# Patient Record
Sex: Male | Born: 1990 | Hispanic: Yes | Marital: Single | Smoking: Former smoker
Health system: Southern US, Community
[De-identification: ages and names within clinical notes are randomized; demographics above are authoritative.]

---

## 2011-01-09 ENCOUNTER — Emergency Department (HOSPITAL_COMMUNITY)
Admission: EM | Admit: 2011-01-09 | Discharge: 2011-01-09 | Disposition: A | Discharge status: Home / Self Care | Payer: Self-pay | Attending: Emergency Medicine | Admitting: Emergency Medicine

## 2011-01-09 DIAGNOSIS — R05 Cough: Secondary | ICD-10-CM | POA: Insufficient documentation

## 2011-01-09 DIAGNOSIS — J069 Acute upper respiratory infection, unspecified: Secondary | ICD-10-CM | POA: Insufficient documentation

## 2011-01-09 DIAGNOSIS — J029 Acute pharyngitis, unspecified: Secondary | ICD-10-CM | POA: Insufficient documentation

## 2011-01-09 DIAGNOSIS — R059 Cough, unspecified: Secondary | ICD-10-CM | POA: Insufficient documentation

## 2011-05-19 ENCOUNTER — Emergency Department (HOSPITAL_COMMUNITY)
Admission: EM | Admit: 2011-05-19 | Discharge: 2011-05-19 | Disposition: A | Discharge status: Home / Self Care | Payer: Self-pay | Attending: Emergency Medicine | Admitting: Emergency Medicine

## 2011-05-19 DIAGNOSIS — Z0389 Encounter for observation for other suspected diseases and conditions ruled out: Secondary | ICD-10-CM | POA: Insufficient documentation

## 2011-05-19 LAB — URINALYSIS, ROUTINE W REFLEX MICROSCOPIC
Bilirubin Urine: NEGATIVE
Hgb urine dipstick: NEGATIVE
Ketones, ur: NEGATIVE mg/dL
Protein, ur: 30 mg/dL — AB
Urobilinogen, UA: 1 mg/dL (ref 0.0–1.0)

## 2011-05-19 LAB — RAPID URINE DRUG SCREEN, HOSP PERFORMED
Amphetamines: NOT DETECTED
Cocaine: NOT DETECTED
Opiates: NOT DETECTED
Tetrahydrocannabinol: POSITIVE — AB

## 2011-05-19 LAB — URINE MICROSCOPIC-ADD ON

## 2011-05-20 ENCOUNTER — Emergency Department (HOSPITAL_COMMUNITY)
Admission: EM | Admit: 2011-05-20 | Discharge: 2011-05-24 | Disposition: A | Discharge status: Home / Self Care | Payer: Self-pay | Attending: Emergency Medicine | Admitting: Emergency Medicine

## 2011-05-20 ENCOUNTER — Ambulatory Visit (HOSPITAL_COMMUNITY)
Admission: RE | Admit: 2011-05-20 | Discharge: 2011-05-20 | Disposition: A | Discharge status: Home / Self Care | Payer: Self-pay | Attending: Psychiatry | Admitting: Psychiatry

## 2011-05-20 DIAGNOSIS — F1920 Other psychoactive substance dependence, uncomplicated: Secondary | ICD-10-CM | POA: Insufficient documentation

## 2011-05-20 DIAGNOSIS — F29 Unspecified psychosis not due to a substance or known physiological condition: Secondary | ICD-10-CM | POA: Insufficient documentation

## 2011-05-20 DIAGNOSIS — F1910 Other psychoactive substance abuse, uncomplicated: Secondary | ICD-10-CM | POA: Insufficient documentation

## 2011-05-20 LAB — RAPID URINE DRUG SCREEN, HOSP PERFORMED
Opiates: NOT DETECTED
Tetrahydrocannabinol: POSITIVE — AB

## 2011-05-20 LAB — COMPREHENSIVE METABOLIC PANEL
AST: 26 U/L (ref 0–37)
Alkaline Phosphatase: 62 U/L (ref 39–117)
BUN: 7 mg/dL (ref 6–23)
CO2: 25 mEq/L (ref 19–32)
Chloride: 103 mEq/L (ref 96–112)
Creatinine, Ser: 0.61 mg/dL (ref 0.50–1.35)
GFR calc non Af Amer: >90 mL/min (ref >90)
Potassium: 3.6 mEq/L (ref 3.5–5.1)
Total Bilirubin: 0.9 mg/dL (ref 0.3–1.2)

## 2011-05-20 LAB — CBC
HCT: 43 % (ref 39.0–52.0)
MCV: 89.4 fL (ref 78.0–100.0)
Platelets: 230 10*3/uL (ref 150–400)
RBC: 4.81 MIL/uL (ref 4.22–5.81)
WBC: 8.5 10*3/uL (ref 4.0–10.5)

## 2011-05-20 LAB — DIFFERENTIAL
Lymphocytes Relative: 31 % (ref 12–46)
Lymphs Abs: 2.7 10*3/uL (ref 0.7–4.0)
Neutrophils Relative %: 62 % (ref 43–77)

## 2016-07-28 ENCOUNTER — Emergency Department (HOSPITAL_COMMUNITY): Payer: No Typology Code available for payment source

## 2016-07-28 ENCOUNTER — Emergency Department (HOSPITAL_COMMUNITY)
Admission: EM | Admit: 2016-07-28 | Discharge: 2016-07-28 | Disposition: A | Discharge status: Home / Self Care | Payer: No Typology Code available for payment source | Attending: Emergency Medicine | Admitting: Emergency Medicine

## 2016-07-28 ENCOUNTER — Encounter (HOSPITAL_COMMUNITY): Payer: Self-pay

## 2016-07-28 DIAGNOSIS — M25552 Pain in left hip: Secondary | ICD-10-CM | POA: Diagnosis not present

## 2016-07-28 DIAGNOSIS — F17210 Nicotine dependence, cigarettes, uncomplicated: Secondary | ICD-10-CM | POA: Diagnosis not present

## 2016-07-28 DIAGNOSIS — Y92410 Unspecified street and highway as the place of occurrence of the external cause: Secondary | ICD-10-CM | POA: Insufficient documentation

## 2016-07-28 DIAGNOSIS — Y999 Unspecified external cause status: Secondary | ICD-10-CM | POA: Insufficient documentation

## 2016-07-28 DIAGNOSIS — Z23 Encounter for immunization: Secondary | ICD-10-CM | POA: Insufficient documentation

## 2016-07-28 DIAGNOSIS — M79605 Pain in left leg: Secondary | ICD-10-CM

## 2016-07-28 DIAGNOSIS — Y939 Activity, unspecified: Secondary | ICD-10-CM | POA: Insufficient documentation

## 2016-07-28 DIAGNOSIS — S80812A Abrasion, left lower leg, initial encounter: Secondary | ICD-10-CM | POA: Diagnosis not present

## 2016-07-28 DIAGNOSIS — S8992XA Unspecified injury of left lower leg, initial encounter: Secondary | ICD-10-CM | POA: Diagnosis present

## 2016-07-28 MED ORDER — TETANUS-DIPHTH-ACELL PERTUSSIS 5-2.5-18.5 LF-MCG/0.5 IM SUSP
0.5000 mL | Freq: Once | INTRAMUSCULAR | Status: AC
  Administered 2016-07-28: 0.5 mL via INTRAMUSCULAR
  Filled 2016-07-28: qty 0.5

## 2016-07-28 MED ORDER — NAPROXEN 500 MG PO TABS: 500.0000 mg | ORAL_TABLET | Freq: Two times a day (BID) | ORAL | 0 refills | Status: DC

## 2016-07-28 NOTE — ED Triage Notes (Signed)
Abrasion noted to upper left medial leg.
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
## 2023-03-04 ENCOUNTER — Encounter (HOSPITAL_COMMUNITY): Payer: Self-pay

## 2023-03-04 ENCOUNTER — Other Ambulatory Visit: Payer: Self-pay

## 2023-03-04 ENCOUNTER — Inpatient Hospital Stay (HOSPITAL_COMMUNITY)
Admission: EM | Admit: 2023-03-04 | Discharge: 2023-03-08 | DRG: 637 | Disposition: A | Discharge status: Home / Self Care | Payer: Medicaid Other | Attending: Critical Care Medicine | Admitting: Critical Care Medicine

## 2023-03-04 ENCOUNTER — Emergency Department (HOSPITAL_COMMUNITY): Payer: Medicaid Other

## 2023-03-04 DIAGNOSIS — G9341 Metabolic encephalopathy: Secondary | ICD-10-CM | POA: Diagnosis present

## 2023-03-04 DIAGNOSIS — E860 Dehydration: Secondary | ICD-10-CM | POA: Diagnosis present

## 2023-03-04 DIAGNOSIS — F17210 Nicotine dependence, cigarettes, uncomplicated: Secondary | ICD-10-CM | POA: Diagnosis present

## 2023-03-04 DIAGNOSIS — R45851 Suicidal ideations: Secondary | ICD-10-CM | POA: Diagnosis present

## 2023-03-04 DIAGNOSIS — E875 Hyperkalemia: Secondary | ICD-10-CM | POA: Diagnosis present

## 2023-03-04 DIAGNOSIS — E1111 Type 2 diabetes mellitus with ketoacidosis with coma: Principal | ICD-10-CM

## 2023-03-04 DIAGNOSIS — E1110 Type 2 diabetes mellitus with ketoacidosis without coma: Secondary | ICD-10-CM

## 2023-03-04 DIAGNOSIS — E1011 Type 1 diabetes mellitus with ketoacidosis with coma: Secondary | ICD-10-CM | POA: Diagnosis present

## 2023-03-04 DIAGNOSIS — E876 Hypokalemia: Secondary | ICD-10-CM | POA: Diagnosis present

## 2023-03-04 DIAGNOSIS — Z79899 Other long term (current) drug therapy: Secondary | ICD-10-CM | POA: Diagnosis not present

## 2023-03-04 DIAGNOSIS — Z597 Insufficient social insurance and welfare support: Secondary | ICD-10-CM

## 2023-03-04 DIAGNOSIS — I152 Hypertension secondary to endocrine disorders: Secondary | ICD-10-CM

## 2023-03-04 DIAGNOSIS — N179 Acute kidney failure, unspecified: Secondary | ICD-10-CM | POA: Diagnosis present

## 2023-03-04 DIAGNOSIS — I10 Essential (primary) hypertension: Secondary | ICD-10-CM | POA: Diagnosis present

## 2023-03-04 DIAGNOSIS — E872 Acidosis, unspecified: Secondary | ICD-10-CM

## 2023-03-04 HISTORY — DX: Type 2 diabetes mellitus with ketoacidosis without coma: E11.10

## 2023-03-04 LAB — RAPID URINE DRUG SCREEN, HOSP PERFORMED
Amphetamines: NOT DETECTED
Barbiturates: NOT DETECTED
Benzodiazepines: NOT DETECTED
Cocaine: NOT DETECTED
Opiates: NOT DETECTED
Tetrahydrocannabinol: POSITIVE — AB

## 2023-03-04 LAB — BASIC METABOLIC PANEL
Anion gap: 21 — ABNORMAL HIGH (ref 5–15)
Anion gap: 19 — ABNORMAL HIGH (ref 5–15)
Anion gap: 14 (ref 5–15)
Anion gap: 12 (ref 5–15)
Anion gap: 12 (ref 5–15)
BUN: 57 mg/dL — ABNORMAL HIGH (ref 6–20)
BUN: 51 mg/dL — ABNORMAL HIGH (ref 6–20)
BUN: 49 mg/dL — ABNORMAL HIGH (ref 6–20)
BUN: 45 mg/dL — ABNORMAL HIGH (ref 6–20)
BUN: 47 mg/dL — ABNORMAL HIGH (ref 6–20)
BUN: 42 mg/dL — ABNORMAL HIGH (ref 6–20)
CO2: <7 mmol/L — ABNORMAL LOW (ref 22–32)
CO2: 8 mmol/L — ABNORMAL LOW (ref 22–32)
CO2: 9 mmol/L — ABNORMAL LOW (ref 22–32)
CO2: 14 mmol/L — ABNORMAL LOW (ref 22–32)
CO2: 16 mmol/L — ABNORMAL LOW (ref 22–32)
CO2: 14 mmol/L — ABNORMAL LOW (ref 22–32)
Calcium: 8.7 mg/dL — ABNORMAL LOW (ref 8.9–10.3)
Calcium: 8.9 mg/dL (ref 8.9–10.3)
Calcium: 8.9 mg/dL (ref 8.9–10.3)
Calcium: 8.8 mg/dL — ABNORMAL LOW (ref 8.9–10.3)
Calcium: 8.8 mg/dL — ABNORMAL LOW (ref 8.9–10.3)
Calcium: 8.7 mg/dL — ABNORMAL LOW (ref 8.9–10.3)
Chloride: 101 mmol/L (ref 98–111)
Chloride: 109 mmol/L (ref 98–111)
Chloride: 113 mmol/L — ABNORMAL HIGH (ref 98–111)
Chloride: 113 mmol/L — ABNORMAL HIGH (ref 98–111)
Chloride: 115 mmol/L — ABNORMAL HIGH (ref 98–111)
Chloride: 115 mmol/L — ABNORMAL HIGH (ref 98–111)
Creatinine, Ser: 5.91 mg/dL — ABNORMAL HIGH (ref 0.61–1.24)
Creatinine, Ser: 3.89 mg/dL — ABNORMAL HIGH (ref 0.61–1.24)
Creatinine, Ser: 3.54 mg/dL — ABNORMAL HIGH (ref 0.61–1.24)
Creatinine, Ser: 2.81 mg/dL — ABNORMAL HIGH (ref 0.61–1.24)
Creatinine, Ser: 2.36 mg/dL — ABNORMAL HIGH (ref 0.61–1.24)
Creatinine, Ser: 2.1 mg/dL — ABNORMAL HIGH (ref 0.61–1.24)
GFR, Estimated: 12 mL/min — ABNORMAL LOW (ref >60)
GFR, Estimated: 20 mL/min — ABNORMAL LOW (ref >60)
GFR, Estimated: 22 mL/min — ABNORMAL LOW (ref >60)
GFR, Estimated: 30 mL/min — ABNORMAL LOW (ref >60)
GFR, Estimated: 37 mL/min — ABNORMAL LOW (ref >60)
GFR, Estimated: 42 mL/min — ABNORMAL LOW (ref >60)
Glucose, Bld: 913 mg/dL (ref 70–99)
Glucose, Bld: 582 mg/dL (ref 70–99)
Glucose, Bld: 357 mg/dL — ABNORMAL HIGH (ref 70–99)
Glucose, Bld: 240 mg/dL — ABNORMAL HIGH (ref 70–99)
Glucose, Bld: 223 mg/dL — ABNORMAL HIGH (ref 70–99)
Glucose, Bld: 213 mg/dL — ABNORMAL HIGH (ref 70–99)
Potassium: 6.4 mmol/L (ref 3.5–5.1)
Potassium: 4 mmol/L (ref 3.5–5.1)
Potassium: 3.2 mmol/L — ABNORMAL LOW (ref 3.5–5.1)
Potassium: 3.4 mmol/L — ABNORMAL LOW (ref 3.5–5.1)
Potassium: 3.4 mmol/L — ABNORMAL LOW (ref 3.5–5.1)
Potassium: 3.4 mmol/L — ABNORMAL LOW (ref 3.5–5.1)
Sodium: 128 mmol/L — ABNORMAL LOW (ref 135–145)
Sodium: 138 mmol/L (ref 135–145)
Sodium: 141 mmol/L (ref 135–145)
Sodium: 141 mmol/L (ref 135–145)
Sodium: 143 mmol/L (ref 135–145)
Sodium: 141 mmol/L (ref 135–145)

## 2023-03-04 LAB — GLUCOSE, CAPILLARY
Glucose-Capillary: 171 mg/dL — ABNORMAL HIGH (ref 70–99)
Glucose-Capillary: 173 mg/dL — ABNORMAL HIGH (ref 70–99)
Glucose-Capillary: 192 mg/dL — ABNORMAL HIGH (ref 70–99)
Glucose-Capillary: 193 mg/dL — ABNORMAL HIGH (ref 70–99)
Glucose-Capillary: 202 mg/dL — ABNORMAL HIGH (ref 70–99)
Glucose-Capillary: 207 mg/dL — ABNORMAL HIGH (ref 70–99)
Glucose-Capillary: 207 mg/dL — ABNORMAL HIGH (ref 70–99)
Glucose-Capillary: 209 mg/dL — ABNORMAL HIGH (ref 70–99)
Glucose-Capillary: 224 mg/dL — ABNORMAL HIGH (ref 70–99)
Glucose-Capillary: 224 mg/dL — ABNORMAL HIGH (ref 70–99)
Glucose-Capillary: 228 mg/dL — ABNORMAL HIGH (ref 70–99)
Glucose-Capillary: 230 mg/dL — ABNORMAL HIGH (ref 70–99)
Glucose-Capillary: 231 mg/dL — ABNORMAL HIGH (ref 70–99)
Glucose-Capillary: 255 mg/dL — ABNORMAL HIGH (ref 70–99)
Glucose-Capillary: 273 mg/dL — ABNORMAL HIGH (ref 70–99)
Glucose-Capillary: 355 mg/dL — ABNORMAL HIGH (ref 70–99)
Glucose-Capillary: 365 mg/dL — ABNORMAL HIGH (ref 70–99)
Glucose-Capillary: 405 mg/dL — ABNORMAL HIGH (ref 70–99)
Glucose-Capillary: 427 mg/dL — ABNORMAL HIGH (ref 70–99)
Glucose-Capillary: 474 mg/dL — ABNORMAL HIGH (ref 70–99)
Glucose-Capillary: 488 mg/dL — ABNORMAL HIGH (ref 70–99)
Glucose-Capillary: 506 mg/dL (ref 70–99)
Glucose-Capillary: 541 mg/dL (ref 70–99)
Glucose-Capillary: >600 mg/dL (ref 70–99)
Glucose-Capillary: >600 mg/dL (ref 70–99)

## 2023-03-04 LAB — BLOOD GAS, VENOUS
Acid-base deficit: 28.2 mmol/L — ABNORMAL HIGH (ref 0.0–2.0)
Acid-base deficit: 19.3 mmol/L — ABNORMAL HIGH (ref 0.0–2.0)
Bicarbonate: 4.9 mmol/L — ABNORMAL LOW (ref 20.0–28.0)
Bicarbonate: 7.5 mmol/L — ABNORMAL LOW (ref 20.0–28.0)
Drawn by: 63366
O2 Saturation: 67.7 %
O2 Saturation: 76.5 %
Patient temperature: 37
Patient temperature: 37
pCO2, Ven: 28 mmHg — ABNORMAL LOW (ref 44–60)
pCO2, Ven: 21 mmHg — ABNORMAL LOW (ref 44–60)
pH, Ven: <6.95 — CL (ref 7.25–7.43)
pH, Ven: 7.16 — CL (ref 7.25–7.43)
pO2, Ven: 37 mmHg (ref 32–45)
pO2, Ven: 38 mmHg (ref 32–45)

## 2023-03-04 LAB — URINALYSIS, ROUTINE W REFLEX MICROSCOPIC
Bilirubin Urine: NEGATIVE
Glucose, UA: >=500 mg/dL — AB
Ketones, ur: 80 mg/dL — AB
Leukocytes,Ua: NEGATIVE
Nitrite: NEGATIVE
Protein, ur: >=300 mg/dL — AB
Specific Gravity, Urine: 1.014 (ref 1.005–1.030)
pH: 6 (ref 5.0–8.0)

## 2023-03-04 LAB — CBC WITH DIFFERENTIAL/PLATELET
Abs Immature Granulocytes: 0.7 10*3/uL — ABNORMAL HIGH (ref 0.00–0.07)
Band Neutrophils: 6 %
Basophils Absolute: 0 10*3/uL (ref 0.0–0.1)
Basophils Relative: 0 %
Eosinophils Absolute: 0 10*3/uL (ref 0.0–0.5)
Eosinophils Relative: 0 %
HCT: 44.7 % (ref 39.0–52.0)
Hemoglobin: 15.3 g/dL (ref 13.0–17.0)
Lymphocytes Relative: 6 %
Lymphs Abs: 1.1 10*3/uL (ref 0.7–4.0)
MCH: 31.7 pg (ref 26.0–34.0)
MCHC: 34.2 g/dL (ref 30.0–36.0)
MCV: 92.5 fL (ref 80.0–100.0)
Metamyelocytes Relative: 4 %
Monocytes Absolute: 0.2 10*3/uL (ref 0.1–1.0)
Monocytes Relative: 1 %
Neutro Abs: 16.2 10*3/uL — ABNORMAL HIGH (ref 1.7–7.7)
Neutrophils Relative %: 83 %
Platelets: 164 10*3/uL (ref 150–400)
RBC: 4.83 MIL/uL (ref 4.22–5.81)
RDW: 15.1 % (ref 11.5–15.5)
WBC: 18.2 10*3/uL — ABNORMAL HIGH (ref 4.0–10.5)
nRBC: 0.2 % (ref 0.0–0.2)

## 2023-03-04 LAB — I-STAT CHEM 8, ED
BUN: 51 mg/dL — ABNORMAL HIGH (ref 6–20)
Calcium, Ion: 1.27 mmol/L (ref 1.15–1.40)
Chloride: 109 mmol/L (ref 98–111)
Creatinine, Ser: 6.1 mg/dL — ABNORMAL HIGH (ref 0.61–1.24)
Glucose, Bld: >700 mg/dL (ref 70–99)
HCT: 45 % (ref 39.0–52.0)
Hemoglobin: 15.3 g/dL (ref 13.0–17.0)
Potassium: 6.4 mmol/L (ref 3.5–5.1)
Sodium: 130 mmol/L — ABNORMAL LOW (ref 135–145)
TCO2: 7 mmol/L — ABNORMAL LOW (ref 22–32)

## 2023-03-04 LAB — HIV ANTIBODY (ROUTINE TESTING W REFLEX): HIV Screen 4th Generation wRfx: NONREACTIVE

## 2023-03-04 LAB — BETA-HYDROXYBUTYRIC ACID
Beta-Hydroxybutyric Acid: >8 mmol/L — ABNORMAL HIGH (ref 0.05–0.27)
Beta-Hydroxybutyric Acid: 5.41 mmol/L — ABNORMAL HIGH (ref 0.05–0.27)
Beta-Hydroxybutyric Acid: 4.11 mmol/L — ABNORMAL HIGH (ref 0.05–0.27)

## 2023-03-04 LAB — CBG MONITORING, ED
Glucose-Capillary: >600 mg/dL (ref 70–99)
Glucose-Capillary: >600 mg/dL (ref 70–99)
Glucose-Capillary: >600 mg/dL (ref 70–99)

## 2023-03-04 LAB — LACTIC ACID, PLASMA
Lactic Acid, Venous: 1.2 mmol/L (ref 0.5–1.9)
Lactic Acid, Venous: 1.5 mmol/L (ref 0.5–1.9)

## 2023-03-04 LAB — HEMOGLOBIN A1C
Hgb A1c MFr Bld: 14.8 % — ABNORMAL HIGH (ref 4.8–5.6)
Mean Plasma Glucose: 378.06 mg/dL

## 2023-03-04 LAB — TROPONIN I (HIGH SENSITIVITY)
Troponin I (High Sensitivity): 13 ng/L (ref <18)
Troponin I (High Sensitivity): 11 ng/L (ref <18)

## 2023-03-04 LAB — MRSA NEXT GEN BY PCR, NASAL: MRSA by PCR Next Gen: NOT DETECTED

## 2023-03-04 LAB — SALICYLATE LEVEL: Salicylate Lvl: <7 mg/dL — ABNORMAL LOW (ref 7.0–30.0)

## 2023-03-04 LAB — ACETAMINOPHEN LEVEL: Acetaminophen (Tylenol), Serum: <10 ug/mL — ABNORMAL LOW (ref 10–30)

## 2023-03-04 MED ORDER — ORAL CARE MOUTH RINSE: 15.0000 mL | OROMUCOSAL | Status: DC | PRN

## 2023-03-04 MED ORDER — ORAL CARE MOUTH RINSE: 15.0000 mL | OROMUCOSAL | Status: DC

## 2023-03-04 MED ORDER — INSULIN STARTER KIT- PEN NEEDLES (SPANISH)
1.0000 | Freq: Once | Status: DC
  Filled 2023-03-04: qty 1

## 2023-03-04 MED ORDER — POTASSIUM CHLORIDE 10 MEQ/100ML IV SOLN
10.0000 meq | INTRAVENOUS | Status: AC
  Administered 2023-03-04 (×2): 10 meq via INTRAVENOUS
  Filled 2023-03-04 (×2): qty 100

## 2023-03-04 MED ORDER — DEXTROSE IN LACTATED RINGERS 5 % IV SOLN: INTRAVENOUS | Status: DC

## 2023-03-04 MED ORDER — LIVING WELL WITH DIABETES BOOK - IN SPANISH
Freq: Once | Status: DC
  Filled 2023-03-04: qty 1

## 2023-03-04 MED ORDER — SODIUM CHLORIDE 0.45 % IV SOLN
Freq: Once | INTRAVENOUS | Status: AC
  Filled 2023-03-04: qty 75

## 2023-03-04 MED ORDER — CALCIUM GLUCONATE 10 % IV SOLN
1.0000 g | Freq: Once | INTRAVENOUS | Status: AC
  Administered 2023-03-04: 1 g via INTRAVENOUS
  Filled 2023-03-04: qty 10

## 2023-03-04 MED ORDER — MAGNESIUM SULFATE 2 GM/50ML IV SOLN
2.0000 g | Freq: Once | INTRAVENOUS | Status: AC
  Administered 2023-03-04: 2 g via INTRAVENOUS
  Filled 2023-03-04: qty 50

## 2023-03-04 MED ORDER — DEXTROSE 50 % IV SOLN: 0.0000 mL | INTRAVENOUS | Status: DC | PRN

## 2023-03-04 MED ORDER — PROMETHAZINE (PHENERGAN) 6.25MG IN NS 50ML IVPB
6.2500 mg | Freq: Four times a day (QID) | INTRAVENOUS | Status: DC | PRN
  Administered 2023-03-06 – 2023-03-07 (×2): 6.25 mg via INTRAVENOUS
  Filled 2023-03-04: qty 6.25
  Filled 2023-03-04: qty 50
  Filled 2023-03-04: qty 6.25

## 2023-03-04 MED ORDER — POTASSIUM CHLORIDE CRYS ER 20 MEQ PO TBCR: 40.0000 meq | EXTENDED_RELEASE_TABLET | Freq: Once | ORAL | Status: DC

## 2023-03-04 MED ORDER — LACTATED RINGERS IV BOLUS: 20.0000 mL/kg | Freq: Once | INTRAVENOUS | Status: DC

## 2023-03-04 MED ORDER — LACTATED RINGERS IV SOLN: INTRAVENOUS | Status: DC

## 2023-03-04 MED ORDER — LACTATED RINGERS IV BOLUS
1000.0000 mL | INTRAVENOUS | Status: AC
  Administered 2023-03-04 (×2): 1000 mL via INTRAVENOUS

## 2023-03-04 MED ORDER — POTASSIUM CHLORIDE 10 MEQ/100ML IV SOLN
10.0000 meq | INTRAVENOUS | Status: AC
  Administered 2023-03-04 (×6): 10 meq via INTRAVENOUS
  Filled 2023-03-04 (×6): qty 100

## 2023-03-04 MED ORDER — INSULIN REGULAR(HUMAN) IN NACL 100-0.9 UT/100ML-% IV SOLN
INTRAVENOUS | Status: DC
  Administered 2023-03-04: 4.2 [IU]/h via INTRAVENOUS
  Filled 2023-03-04: qty 100

## 2023-03-04 MED ORDER — LORAZEPAM 2 MG/ML IJ SOLN
0.5000 mg | INTRAMUSCULAR | Status: DC | PRN
  Administered 2023-03-04: 0.5 mg via INTRAVENOUS
  Filled 2023-03-04: qty 1

## 2023-03-04 MED ORDER — CHLORHEXIDINE GLUCONATE CLOTH 2 % EX PADS
6.0000 | MEDICATED_PAD | Freq: Every day | CUTANEOUS | Status: DC
  Administered 2023-03-04 – 2023-03-06 (×3): 6 via TOPICAL

## 2023-03-04 MED ORDER — ONDANSETRON 8 MG/NS 50 ML IVPB
8.0000 mg | Freq: Four times a day (QID) | INTRAVENOUS | Status: DC | PRN
  Administered 2023-03-04 – 2023-03-05 (×2): 8 mg via INTRAVENOUS
  Filled 2023-03-04 (×2): qty 8
  Filled 2023-03-04: qty 54

## 2023-03-04 MED ORDER — LACTATED RINGERS IV BOLUS
1000.0000 mL | Freq: Once | INTRAVENOUS | Status: AC
  Administered 2023-03-04: 1000 mL via INTRAVENOUS

## 2023-03-04 MED ORDER — POTASSIUM CHLORIDE 10 MEQ/100ML IV SOLN
10.0000 meq | INTRAVENOUS | Status: AC
  Administered 2023-03-04 – 2023-03-05 (×6): 10 meq via INTRAVENOUS
  Filled 2023-03-04 (×3): qty 100

## 2023-03-04 MED ORDER — INSULIN REGULAR(HUMAN) IN NACL 100-0.9 UT/100ML-% IV SOLN
INTRAVENOUS | Status: DC
  Administered 2023-03-04: 2.6 [IU]/h via INTRAVENOUS
  Administered 2023-03-04: 4.2 [IU]/h via INTRAVENOUS
  Filled 2023-03-04: qty 100

## 2023-03-04 MED ORDER — OLANZAPINE 10 MG IM SOLR
2.5000 mg | Freq: Four times a day (QID) | INTRAMUSCULAR | Status: DC | PRN
  Administered 2023-03-04: 2.5 mg via INTRAMUSCULAR
  Filled 2023-03-04 (×3): qty 10

## 2023-03-04 MED ORDER — HEPARIN SODIUM (PORCINE) 5000 UNIT/ML IJ SOLN
5000.0000 [IU] | Freq: Three times a day (TID) | INTRAMUSCULAR | Status: DC
  Administered 2023-03-04 – 2023-03-08 (×13): 5000 [IU] via SUBCUTANEOUS
  Filled 2023-03-04 (×11): qty 1

## 2023-03-04 MED ORDER — SODIUM ZIRCONIUM CYCLOSILICATE 10 G PO PACK: 10.0000 g | PACK | Freq: Once | ORAL | Status: DC

## 2023-03-04 MED ORDER — SODIUM BICARBONATE 8.4 % IV SOLN
50.0000 meq | Freq: Once | INTRAVENOUS | Status: AC
  Administered 2023-03-04: 50 meq via INTRAVENOUS
  Filled 2023-03-04: qty 50

## 2023-03-04 MED ORDER — SODIUM CHLORIDE 0.9 % IV SOLN
8.0000 mg | Freq: Four times a day (QID) | INTRAVENOUS | Status: DC | PRN
  Administered 2023-03-04: 8 mg via INTRAVENOUS
  Filled 2023-03-04: qty 8
  Filled 2023-03-04: qty 4

## 2023-03-04 MED ORDER — LORAZEPAM 0.5 MG PO TABS
0.5000 mg | ORAL_TABLET | ORAL | Status: DC | PRN
  Filled 2023-03-04: qty 1

## 2023-03-04 MED ORDER — LIVING WELL WITH DIABETES BOOK
Freq: Once | Status: DC
  Filled 2023-03-04: qty 1

## 2023-03-04 NOTE — Progress Notes (Addendum)
eLink Physician-Brief Progress Note Patient Name: Charles Cooper DOB: [birth year] MRN: [id number]   Date of Service  03/04/2023  HPI/Events of Note  32 year old with a history of DKA presented with encephalopathy, DKA is improving.  Endo tool was improperly scheduled for hyperglycemia rather than DKA.  eICU Interventions  Will switch Endo tool back to DKA management.   2233 -potassium 3.4, remains on insulin drip.  KCl infusion.  96 - Renew Sitter order for SI until psych eval  Intervention Category Minor Interventions: Routine modifications to care plan (e.g. PRN medications for pain, fever)  Charles Cooper 03/04/2023, 9:43 PM

## 2023-03-04 NOTE — Plan of Care (Signed)
  Problem: Coping: Goal: Ability to adjust to condition or change in health will improve Outcome: Progressing   Problem: Fluid Volume: Goal: Ability to maintain a balanced intake and output will improve Outcome: Progressing   
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
## 2023-03-04 NOTE — H&P (Signed)
NAME:  Charles Cooper, MRN:  [id number], DOB:  [birth year], LOS: 0 ADMISSION DATE:  03/04/2023, CONSULTATION DATE: 03/04/2023 REFERRING MD: Dr. Manus Gunning, CHIEF COMPLAINT: DKA  History of Present Illness:  32 year old gentleman who was brought in for altered mental status Agitated at home, by the time he got to the emergency department was somnolent History that was provided was by EMS, patient was stating that he was going to hurt himself.  EMS found his blood sugar too high to detect  Has no history of underlying diabetes known to anyone Pertinent  Medical History  History reviewed. No pertinent past medical history.   Significant Hospital Events: Including procedures, antibiotic start and stop dates in addition to other pertinent events   03/04/2023-critical care consult for DKA, altered mental status, severe acidemia 03/04/2023-CT head with no acute abnormality 03/04/2023-chest x-ray with no acute abnormality  Interim History / Subjective:  Somnolent, he does arouse, unable to provide any history  Objective   Blood pressure 123/63, pulse (!) 123, temperature 98.5 F (36.9 C), temperature source Oral, resp. rate (!) 27, SpO2 100%.       No intake or output data in the 24 hours ending 03/04/23 0240 There were no vitals filed for this visit.  Examination: General: Middle-age, acutely ill-appearing HENT: Very dry mucous membranes Lungs: Clear breath sounds Cardiovascular: S1-S2 appreciated Abdomen: Soft, bowel sounds appreciated Extremities: No clubbing, no edema Neuro: Somnolent, moving all extremities GU:   I reviewed nursing notes, last 24 h vitals and pain scores, last 48 h intake and output, last 24 h labs and trends, and last 24 h imaging results.  Resolved Hospital Problem list     Assessment & Plan:  Diabetic ketoacidosis -Unknown history of diabetes, no listed medications to suggest he has diabetes -DKA management per protocol -On insulin via  infusion -Receiving fluid bolus and will initiate LR at 250 cc an hour following completing the bolus  Severe acidemia with pH 6.95  Acute kidney injury -Likely related to severe dehydration -Maintain renal perfusion -Avoid nephrotoxic medications -Treat underlying DKA  Hyperkalemia -Did receive calcium gluconate, unable to receive Lokelma secondary to his altered mental status -Did receive 1 amp of bicarb -Will place him on a drip of 75 mEq of bicarb in 1 L of 0.45 saline over 2 hours -Continue to monitor electrolytes -EKG is sinus  Monitor electrolytes every 4 Monitor blood sugar hourly Does not appear to have underlying infectious etiology-will monitor, follow cultures, follow urinalysis Follow beta hydroxybutyric acid  Risk of decompensation is high  Altered mental serum is likely related to his DKA Assessment of his mental status and his threats regarding hurting himself will need to be evaluated once he is more stable  Best Practice (right click and "Reselect all SmartList Selections" daily)   Diet/type: NPO DVT prophylaxis: prophylactic heparin  GI prophylaxis: N/A Lines: N/A Foley:  Yes, and it is still needed Code Status:  full code Last date of multidisciplinary goals of care discussion     Labs   CBC: Recent Labs  Lab 03/04/23 0113 03/04/23 0124  WBC 18.2*  --   NEUTROABS 16.2*  --   HGB 15.3 15.3  HCT 44.7 45.0  MCV 92.5  --   PLT 164  --     Basic Metabolic Panel: Recent Labs  Lab 03/04/23 0113 03/04/23 0124  NA 128* 130*  K 6.4* 6.4*  CL 101 109  CO2 <7*  --   GLUCOSE 913* >700*  BUN 57* 51*  CREATININE 5.91* 6.10*  CALCIUM 8.7*  --    GFR: CrCl cannot be calculated (Unknown ideal weight.). Recent Labs  Lab 03/04/23 0113 03/04/23 0119  WBC 18.2*  --   LATICACIDVEN  --  1.2    Liver Function Tests: No results for input(s): "AST", "ALT", "ALKPHOS", "BILITOT", "PROT", "ALBUMIN" in the last 168 hours. No results for input(s):  "LIPASE", "AMYLASE" in the last 168 hours. No results for input(s): "AMMONIA" in the last 168 hours.  ABG    Component Value Date/Time   HCO3 4.9 (L) 03/04/2023 0113   TCO2 7 (L) 03/04/2023 0124   ACIDBASEDEF 28.2 (H) 03/04/2023 0113   O2SAT 67.7 03/04/2023 0113     Coagulation Profile: No results for input(s): "INR", "PROTIME" in the last 168 hours.  Cardiac Enzymes: No results for input(s): "CKTOTAL", "CKMB", "CKMBINDEX", "TROPONINI" in the last 168 hours.  HbA1C: No results found for: "HGBA1C"  CBG: Recent Labs  Lab 03/04/23 0051 03/04/23 0216  GLUCAP >600* >600*    Review of Systems:   Unable to provide history  Past Medical History:  He,  has no past medical history on file.   Surgical History:  History reviewed. No pertinent surgical history.   Social History:   reports that he has been smoking cigarettes. He has never used smokeless tobacco. He reports current drug use. Drug: Marijuana. He reports that he does not drink alcohol.   Family History:  His family history is not on file.   Allergies No Known Allergies   Home Medications  Prior to Admission medications   Medication Sig Start Date End Date Taking? Authorizing Provider  naproxen (NAPROSYN) 500 MG tablet Take 1 tablet (500 mg total) by mouth 2 (two) times daily. 07/28/16   [name], NP     The patient is critically ill with multiple organ systems failure and requires high complexity decision making for assessment and support, frequent evaluation and titration of therapies, application of advanced monitoring technologies and extensive interpretation of multiple databases. Critical Care Time devoted to patient care services described in this note independent of APP/resident time (if applicable)  is 40 minutes.   Virl Diamond MD [facility] Personal pager: See Amion If unanswered, please page CCM On-call: [phone]

## 2023-03-04 NOTE — Progress Notes (Addendum)
eLink Physician-Brief Progress Note Patient Name: Charles Cooper DOB: [birth year] MRN: [id number]   Date of Service  03/04/2023  HPI/Events of Note  32 year old male with a history of diabetes presents with diabetic ketoacidosis and acute encephalopathy.  Presents tachypneic, tachycardic, and mildly hypertensive.  Results consistent with severe hyperglycemia, anion gap metabolic acidosis, acute kidney injury, and leukocytosis.  CT head unremarkable.  ECG with sinus tachycardia.  eICU Interventions  Insulin drip, aggressive fluids, electrolyte repletion.  Labs reviewed-BMP Q4, BHB every 8  Additional LR bolus 1 L, bicarb 1L ordered by primary also  Heparin subcutaneous for DVT prophylaxis  No indication for GI prophylaxis.   0559 -AM ABG reviewed, improved pH.  No indication to repeat.  Continue to trend BMP.  No change in plan.  Intervention Category Evaluation Type: New Patient Evaluation  Charles Cooper 03/04/2023, 4:02 AM

## 2023-03-04 NOTE — Inpatient Diabetes Management (Signed)
Inpatient Diabetes Program Recommendations  AACE/ADA: New Consensus Statement on Inpatient Glycemic Control (2015)  Target Ranges:  Prepandial:   less than 140 mg/dL      Peak postprandial:   less than 180 mg/dL (1-2 hours)      Critically ill patients:  140 - 180 mg/dL   Lab Results  Component Value Date   GLUCAP 405 (H) 03/04/2023    Review of Glycemic Control  Diabetes history: New-onset DM Outpatient Diabetes medications: None Current orders for Inpatient glycemic control: IV insulin per [employer] for DKA  HgbA1C pending Will need affordable insulin at discharge since pt is uninsured.  Ordered Living Well book and insulin pen starter kit.   Inpatient Diabetes Program Recommendations:    New-onset DM. Will begin diabetes teaching when appropriate.  Will need PCP to manage his diabetes after discharge. Order TOC consult  Continue IV insulin until criteria met for discontinuation. Give Semglee 1-2 hours prior to discontinuation of drip.  Will speak with pt and begin educating when appropriate.   Continue to follow.  Thank you. Ailene Ards, RD, LDN, CDCES Inpatient Diabetes Coordinator [phone]

## 2023-03-04 NOTE — Progress Notes (Signed)
Called lab phlebotomy for lab draw timed for 0455.

## 2023-03-04 NOTE — ED Provider Notes (Signed)
[facility] AT [facility] Provider Note   CSN: [id number] Arrival date & time: 03/04/23  0040     History  Chief Complaint  Patient presents with   Hyperglycemia   Suicidal    Charles Cooper is a 32 y.o. male.  Level 5 caveat for altered mental status.  Patient unable give any history.  EMS reports he was telling multiple people today that was having thoughts of wanting to hurt himself.  He was aggressive at home and destroying property.  EMS was called and found patient to have elevated blood sugar too high to detect.  Unknown history of diabetes.  Patient somnolent and altered on arrival and unable to give any history.  He is not speaking.  Vital signs stable for EMS.  The history is provided by the patient and the EMS personnel. The history is limited by the condition of the patient.  Hyperglycemia      Home Medications Prior to Admission medications   Medication Sig Start Date End Date Taking? Authorizing Provider  naproxen (NAPROSYN) 500 MG tablet Take 1 tablet (500 mg total) by mouth 2 (two) times daily. 07/28/16   [name], NP      Allergies    Patient has no known allergies.    Review of Systems   Review of Systems  Unable to perform ROS: Mental status change    Physical Exam Updated Vital Signs BP 123/63   Pulse (!) 123   Temp 98.5 F (36.9 C) (Oral)   Resp (!) 27   SpO2 100%  Physical Exam Vitals and nursing note reviewed.  Constitutional:      General: He is not in acute distress.    Appearance: He is well-developed. He is ill-appearing.     Comments: Obtunded, responds to pain does not speak or follow commands  HENT:     Head: Normocephalic and atraumatic.     Mouth/Throat:     Mouth: Mucous membranes are dry.     Pharynx: No oropharyngeal exudate.  Eyes:     Conjunctiva/sclera: Conjunctivae normal.     Pupils: Pupils are equal, round, and reactive to light.  Neck:     Comments: No  meningismus. Cardiovascular:     Rate and Rhythm: Regular rhythm. Tachycardia present.     Heart sounds: Normal heart sounds. No murmur heard. Pulmonary:     Effort: Pulmonary effort is normal. No respiratory distress.     Breath sounds: Normal breath sounds.  Abdominal:     Palpations: Abdomen is soft.     Tenderness: There is abdominal tenderness. There is no guarding or rebound.     Comments: Diffuse crampy abdominal tenderness, no guarding or rebound  Musculoskeletal:        General: No tenderness. Normal range of motion.     Cervical back: Normal range of motion and neck supple.  Skin:    General: Skin is warm.  Neurological:     Mental Status: He is alert.     Cranial Nerves: No cranial nerve deficit.     Motor: No abnormal muscle tone.     Coordination: Coordination normal.     Comments: Obtunded, moans to pain, moves all extremities. Does not follow commands  Psychiatric:        Behavior: Behavior normal.     ED Results / Procedures / Treatments   Labs (all labs ordered are listed, but only abnormal results are displayed) Labs Reviewed  BASIC METABOLIC PANEL - Abnormal;  Notable for the following components:      Result Value   Sodium 128 (*)    Potassium 6.4 (*)    CO2 <7 (*)    Glucose, Bld 913 (*)    BUN 57 (*)    Creatinine, Ser 5.91 (*)    Calcium 8.7 (*)    GFR, Estimated 12 (*)    All other components within normal limits  BETA-HYDROXYBUTYRIC ACID - Abnormal; Notable for the following components:   Beta-Hydroxybutyric Acid >8.00 (*)    All other components within normal limits  CBC WITH DIFFERENTIAL/PLATELET - Abnormal; Notable for the following components:   WBC 18.2 (*)    Neutro Abs 16.2 (*)    Abs Immature Granulocytes 0.70 (*)    All other components within normal limits  BLOOD GAS, VENOUS - Abnormal; Notable for the following components:   pH, Ven <6.95 (*)    pCO2, Ven 28 (*)    Bicarbonate 4.9 (*)    Acid-base deficit 28.2 (*)    All other  components within normal limits  ACETAMINOPHEN LEVEL - Abnormal; Notable for the following components:   Acetaminophen (Tylenol), Serum <10 (*)    All other components within normal limits  SALICYLATE LEVEL - Abnormal; Notable for the following components:   Salicylate Lvl <7.0 (*)    All other components within normal limits  GLUCOSE, CAPILLARY - Abnormal; Notable for the following components:   Glucose-Capillary >600 (*)    All other components within normal limits  GLUCOSE, CAPILLARY - Abnormal; Notable for the following components:   Glucose-Capillary >600 (*)    All other components within normal limits  GLUCOSE, CAPILLARY - Abnormal; Notable for the following components:   Glucose-Capillary 541 (*)    All other components within normal limits  CBG MONITORING, ED - Abnormal; Notable for the following components:   Glucose-Capillary >600 (*)    All other components within normal limits  CBG MONITORING, ED - Abnormal; Notable for the following components:   Glucose-Capillary >600 (*)    All other components within normal limits  I-STAT CHEM 8, ED - Abnormal; Notable for the following components:   Sodium 130 (*)    Potassium 6.4 (*)    BUN 51 (*)    Creatinine, Ser 6.10 (*)    Glucose, Bld >700 (*)    TCO2 7 (*)    All other components within normal limits  CBG MONITORING, ED - Abnormal; Notable for the following components:   Glucose-Capillary >600 (*)    All other components within normal limits  MRSA NEXT GEN BY PCR, NASAL  LACTIC ACID, PLASMA  BASIC METABOLIC PANEL  BASIC METABOLIC PANEL  BASIC METABOLIC PANEL  BASIC METABOLIC PANEL  BETA-HYDROXYBUTYRIC ACID  BETA-HYDROXYBUTYRIC ACID  URINALYSIS, ROUTINE W REFLEX MICROSCOPIC  BLOOD GAS, VENOUS  LACTIC ACID, PLASMA  HIV ANTIBODY (ROUTINE TESTING W REFLEX)  HEMOGLOBIN A1C  RAPID URINE DRUG SCREEN, HOSP PERFORMED  TROPONIN I (HIGH SENSITIVITY)  TROPONIN I (HIGH SENSITIVITY)    EKG EKG  Interpretation Date/Time:  Thursday March 04 2023 00:54:51 EDT Ventricular Rate:  120 PR Interval:  143 QRS Duration:  93 QT Interval:  313 QTC Calculation: 443 R Axis:   103  Text Interpretation: Sinus tachycardia Right axis deviation ST elev, probable normal early repol pattern No previous ECGs available Confirmed by Glynn Octave [phone]) on 03/04/2023 1:08:10 AM  Radiology CT Head Wo Contrast  Result Date: 03/04/2023 CLINICAL DATA:  Altered mental status, nontraumatic (Ped 0-17y) EXAM:  CT HEAD WITHOUT CONTRAST TECHNIQUE: Contiguous axial images were obtained from the base of the skull through the vertex without intravenous contrast. RADIATION DOSE REDUCTION: This exam was performed according to the departmental dose-optimization program which includes automated exposure control, adjustment of the mA and/or kV according to patient size and/or use of iterative reconstruction technique. COMPARISON:  None Available. FINDINGS: Brain: No acute intracranial abnormality. Specifically, no hemorrhage, hydrocephalus, mass lesion, acute infarction, or significant intracranial injury. Vascular: No hyperdense vessel or unexpected calcification. Skull: No acute calvarial abnormality. Sinuses/Orbits: No acute findings Other: None IMPRESSION: Normal study. Electronically Signed   By: Charlett Nose M.D.   On: 03/04/2023 01:47   DG Chest Portable 1 View  Result Date: 03/04/2023 CLINICAL DATA:  Altered mental status, hyperglycemia EXAM: PORTABLE CHEST 1 VIEW COMPARISON:  None Available. FINDINGS: The heart size and mediastinal contours are within normal limits. Both lungs are clear. The visualized skeletal structures are unremarkable. IMPRESSION: No active disease. Electronically Signed   By: Charlett Nose M.D.   On: 03/04/2023 01:09    Procedures .Critical Care  Performed by: Glynn Octave, MD Authorized by: Glynn Octave, MD   Critical care provider statement:    Critical care time (minutes):  60    Critical care time was exclusive of:  Separately billable procedures and treating other patients   Critical care was necessary to treat or prevent imminent or life-threatening deterioration of the following conditions:  Endocrine crisis, dehydration and metabolic crisis   Critical care was time spent personally by me on the following activities:  Development of treatment plan with patient or surrogate, discussions with consultants, evaluation of patient's response to treatment, examination of patient, ordering and review of laboratory studies, ordering and review of radiographic studies, ordering and performing treatments and interventions, pulse oximetry, re-evaluation of patient's condition, review of old charts, blood draw for specimens and obtaining history from patient or surrogate   I assumed direction of critical care for this patient from another provider in my specialty: no     Care discussed with: admitting provider       Medications Ordered in ED Medications  lactated ringers infusion (0 mLs Intravenous Hold 03/04/23 0220)  dextrose 5 % in lactated ringers infusion (0 mLs Intravenous Hold 03/04/23 0220)  dextrose 50 % solution 0-50 mL (has no administration in time range)  sodium zirconium cyclosilicate (LOKELMA) packet 10 g (0 g Oral Hold 03/04/23 0220)  heparin injection 5,000 Units (has no administration in time range)  lactated ringers infusion ( Intravenous Infusion Verify 03/04/23 0511)  dextrose 5 % in lactated ringers infusion (has no administration in time range)  dextrose 50 % solution 0-50 mL (has no administration in time range)  insulin regular, human (MYXREDLIN) 100 units/ 100 mL infusion (3.8 Units/hr Intravenous Infusion Verify 03/04/23 0511)  sodium bicarbonate 75 mEq in sodium chloride 0.45 % 1,075 mL infusion ( Intravenous Infusion Verify 03/04/23 0511)  ondansetron (ZOFRAN) 8 mg in sodium chloride 0.9 % 50 mL IVPB (0 mg Intravenous Stopped 03/04/23 0441)  Chlorhexidine Gluconate  Cloth 2 % PADS 6 each (has no administration in time range)  lactated ringers bolus 20 mL/kg ( Intravenous New Bag/Given 03/04/23 0411)  calcium gluconate inj 10% (1 g) URGENT USE ONLY! (1 g Intravenous Given 03/04/23 0145)  sodium bicarbonate injection 50 mEq (50 mEq Intravenous Given 03/04/23 0151)  lactated ringers bolus 1,000 mL (1,000 mLs Intravenous Bolus 03/04/23 0151)  lactated ringers bolus 1,000 mL (0 mLs Intravenous Stopped 03/04/23 0515)  ED Course/ Medical Decision Making/ A&P                                 Medical Decision Making Amount and/or Complexity of Data Reviewed Independent Historian: EMS Labs: ordered. Decision-making details documented in ED Course. Radiology: ordered and independent interpretation performed. Decision-making details documented in ED Course. ECG/medicine tests: ordered and independent interpretation performed. Decision-making details documented in ED Course.  Risk Prescription drug management. Decision regarding hospitalization.   Altered mental status and hyperglycemia.  Was reportedly suicidal at home.  Agitated at home.  Tachycardic but stable blood pressure.  No fever.  Very dry mucous membranes with elevated CBG.  Concern for new onset diabetes and likely DKA.  IV fluids given, IV insulin.  Labs show hyperkalemia of 6.4, creatinine 6.1, bicarb 7.  Will initiate IV fluids and IV insulin.   Bicarb and calcium given his hyperkalemia.  No obvious EKG changes.  pH 6.95  CT head negative for acute change.  Results reviewed interpreted by me.  Patient remains tachycardic but blood pressure improving to IV fluids.  123/63.  Status change likely secondary new onset DKA and metabolic acidosis.  Continue IV fluids and IV insulin.  Admission discussed with critical care Dr. Wynona Neat.   ICU admission for severe DKA with hyperkalemia, AKI and encephalopathy.  CT head is stable. Protecting airway.  Tachycardic but blood pressure has improved to IV  fluids.  Continue IV fluids and IV insulin.  Admission discussed with Dr. Wynona Neat.        Final Clinical Impression(s) / ED Diagnoses Final diagnoses:  Diabetic ketoacidosis with coma associated with type 2 diabetes mellitus (HCC)  Metabolic acidosis  Hyperkalemia    Rx / DC Orders ED Discharge Orders     None         Hatsuko Bizzarro, Jeannett Senior, MD 03/04/23 [phone]

## 2023-03-04 NOTE — Progress Notes (Signed)
NAME:  Charles Cooper, MRN:  [id number], DOB:  1990/12/12, LOS: 0 ADMISSION DATE:  03/04/2023, CONSULTATION DATE: 03/04/2023 REFERRING MD: Dr. Manus Gunning, CHIEF COMPLAINT: DKA  History of Present Illness:  32 year old gentleman who was brought in for altered mental status Agitated at home, by the time he got to the emergency department was somnolent History that was provided was by EMS, patient was stating that he was going to hurt himself.  EMS found his blood sugar too high to detect  Has no history of underlying diabetes known to anyone Pertinent  Medical History  History reviewed. No pertinent past medical history.   Significant Hospital Events: Including procedures, antibiotic start and stop dates in addition to other pertinent events   03/04/2023-critical care consult for DKA, altered mental status, severe acidemia 03/04/2023-CT head with no acute abnormality 03/04/2023-chest x-ray with no acute abnormality  Interim History / Subjective:  Remains confused. Needs a bedside sitter.   Objective   Blood pressure (!) 158/94, pulse (!) 107, temperature (!) 97.4 F (36.3 C), temperature source Axillary, resp. rate (!) 22, height 5\' 3"  (1.6 m), weight 70.9 kg, SpO2 99%.        Intake/Output Summary (Last 24 hours) at 03/04/2023 0812 Last data filed at 03/04/2023 0650 Gross per 24 hour  Intake 3830.32 ml  Output 1500 ml  Net 2330.32 ml   Filed Weights   03/04/23 0500  Weight: 70.9 kg    Examination: General: chronically ill appearing man lying in bed, disheveled appearing HENT: [place]/AT, eyes anicteric. Dry oral mucosa.  Lungs: mild tachypnea, CTAB Cardiovascular: S1S2, breathing comfortably on RA Abdomen: soft, ND Extremities: No cyanosis, no edema.  Neuro: sleeping, arousable, moving around bed frequently, moving all extremities GU: foley  7.16/21/  /7.5  K+ 4.0 BUN 51 Cr 3.89 LA 1.5 Trop 11     Resolved Hospital Problem list     Assessment & Plan:  Severe  diabetic ketoacidosis; severe metabolic acidosis -Unknown history of diabetes, no listed medications to suggest he has diabetes -bolus 2L LR -con't maintenance fluids -con't insulin per endotool; con't monitor B-OH-butyrate & BMPs  Acute kidney injury, improving. Mostly likely pre-renal from severe volume contraction -con't foley -strict I/O -additional fluid boluses given  -DKA management  Hypokalemia; resolved hyperkalemia -potassium repletion -con't frequent monitoring  Acute metabolic encephalopathy due to DKA, severe metabolic acidosis -correct metabolic derangements -remain strict NPO until mentation improves -avoid deliriogenic meds  Suicidal ideation -needs psychiatry consultation once able to participate -bedside sitter -needs to be IVC'ed once no longer encephalopathic  Best Practice (right click and "Reselect all SmartList Selections" daily)   Diet/type: NPO DVT prophylaxis: prophylactic heparin  GI prophylaxis: N/A Lines: N/A Foley:  Yes, and it is still needed Code Status:  full code Last date of multidisciplinary goals of care discussion : TBD    Labs   CBC: Recent Labs  Lab 03/04/23 0113 03/04/23 0124  WBC 18.2*  --   NEUTROABS 16.2*  --   HGB 15.3 15.3  HCT 44.7 45.0  MCV 92.5  --   PLT 164  --     Basic Metabolic Panel: Recent Labs  Lab 03/04/23 0113 03/04/23 0124 03/04/23 0533  NA 128* 130* 138  K 6.4* 6.4* 4.0  CL 101 109 109  CO2 <7*  --  8*  GLUCOSE 913* >700* 582*  BUN 57* 51* 51*  CREATININE 5.91* 6.10* 3.89*  CALCIUM 8.7*  --  8.9   GFR: Estimated Creatinine Clearance: 24.1 mL/min (A) (  by C-G formula based on SCr of 3.89 mg/dL (H)). Recent Labs  Lab 03/04/23 0113 03/04/23 0119 03/04/23 0533  WBC 18.2*  --   --   LATICACIDVEN  --  1.2 1.5      This patient is critically ill with multiple organ system failure which requires frequent high complexity decision making, assessment, support, evaluation, and titration of  therapies. This was completed through the application of advanced monitoring technologies and extensive interpretation of multiple databases. During this encounter critical care time was devoted to patient care services described in this note for 40 minutes.  Steffanie Dunn, DO 03/04/23 10:39 AM [facility]  For contact information, see Amion. If no response to pager, please call PCCM consult pager. After hours, 7PM- 7AM, please call Elink.

## 2023-03-05 ENCOUNTER — Other Ambulatory Visit: Payer: Self-pay

## 2023-03-05 LAB — GLUCOSE, CAPILLARY
Glucose-Capillary: 117 mg/dL — ABNORMAL HIGH (ref 70–99)
Glucose-Capillary: 128 mg/dL — ABNORMAL HIGH (ref 70–99)
Glucose-Capillary: 130 mg/dL — ABNORMAL HIGH (ref 70–99)
Glucose-Capillary: 135 mg/dL — ABNORMAL HIGH (ref 70–99)
Glucose-Capillary: 135 mg/dL — ABNORMAL HIGH (ref 70–99)
Glucose-Capillary: 135 mg/dL — ABNORMAL HIGH (ref 70–99)
Glucose-Capillary: 141 mg/dL — ABNORMAL HIGH (ref 70–99)
Glucose-Capillary: 143 mg/dL — ABNORMAL HIGH (ref 70–99)
Glucose-Capillary: 143 mg/dL — ABNORMAL HIGH (ref 70–99)
Glucose-Capillary: 144 mg/dL — ABNORMAL HIGH (ref 70–99)
Glucose-Capillary: 145 mg/dL — ABNORMAL HIGH (ref 70–99)
Glucose-Capillary: 146 mg/dL — ABNORMAL HIGH (ref 70–99)
Glucose-Capillary: 147 mg/dL — ABNORMAL HIGH (ref 70–99)
Glucose-Capillary: 150 mg/dL — ABNORMAL HIGH (ref 70–99)
Glucose-Capillary: 152 mg/dL — ABNORMAL HIGH (ref 70–99)
Glucose-Capillary: 153 mg/dL — ABNORMAL HIGH (ref 70–99)
Glucose-Capillary: 155 mg/dL — ABNORMAL HIGH (ref 70–99)
Glucose-Capillary: 156 mg/dL — ABNORMAL HIGH (ref 70–99)
Glucose-Capillary: 170 mg/dL — ABNORMAL HIGH (ref 70–99)
Glucose-Capillary: 172 mg/dL — ABNORMAL HIGH (ref 70–99)
Glucose-Capillary: 176 mg/dL — ABNORMAL HIGH (ref 70–99)
Glucose-Capillary: 177 mg/dL — ABNORMAL HIGH (ref 70–99)
Glucose-Capillary: 190 mg/dL — ABNORMAL HIGH (ref 70–99)
Glucose-Capillary: 197 mg/dL — ABNORMAL HIGH (ref 70–99)

## 2023-03-05 LAB — BASIC METABOLIC PANEL
Anion gap: 6 (ref 5–15)
BUN: 29 mg/dL — ABNORMAL HIGH (ref 6–20)
CO2: 19 mmol/L — ABNORMAL LOW (ref 22–32)
Calcium: 8.5 mg/dL — ABNORMAL LOW (ref 8.9–10.3)
Chloride: 113 mmol/L — ABNORMAL HIGH (ref 98–111)
Creatinine, Ser: 1.42 mg/dL — ABNORMAL HIGH (ref 0.61–1.24)
GFR, Estimated: >60 mL/min (ref >60)
Glucose, Bld: 146 mg/dL — ABNORMAL HIGH (ref 70–99)
Potassium: 3.1 mmol/L — ABNORMAL LOW (ref 3.5–5.1)
Sodium: 138 mmol/L (ref 135–145)

## 2023-03-05 LAB — BETA-HYDROXYBUTYRIC ACID: Beta-Hydroxybutyric Acid: 1.46 mmol/L — ABNORMAL HIGH (ref 0.05–0.27)

## 2023-03-05 LAB — MAGNESIUM: Magnesium: 2.5 mg/dL — ABNORMAL HIGH (ref 1.7–2.4)

## 2023-03-05 MED ORDER — POTASSIUM CHLORIDE 20 MEQ PO PACK
40.0000 meq | PACK | Freq: Once | ORAL | Status: AC
  Administered 2023-03-05: 40 meq via ORAL
  Filled 2023-03-05: qty 2

## 2023-03-05 MED ORDER — POTASSIUM CHLORIDE 10 MEQ/100ML IV SOLN
10.0000 meq | INTRAVENOUS | Status: AC
  Administered 2023-03-05 (×4): 10 meq via INTRAVENOUS
  Filled 2023-03-05 (×4): qty 100

## 2023-03-05 MED ORDER — MAGNESIUM SULFATE 2 GM/50ML IV SOLN
2.0000 g | Freq: Once | INTRAVENOUS | Status: AC
  Administered 2023-03-05: 2 g via INTRAVENOUS
  Filled 2023-03-05: qty 50

## 2023-03-05 NOTE — Plan of Care (Signed)
  Problem: Education: Goal: Ability to describe self-care measures that may prevent or decrease complications (Diabetes Survival Skills Education) will improve Outcome: Not Progressing

## 2023-03-05 NOTE — Progress Notes (Signed)
NAME:  Charles Cooper, MRN:  [id number], DOB:  10-22-1990, LOS: 1 ADMISSION DATE:  03/04/2023, CONSULTATION DATE: 03/04/2023 REFERRING MD: Dr. Manus Gunning, CHIEF COMPLAINT: DKA  History of Present Illness:  32 year old gentleman who was brought in for altered mental status Agitated at home, by the time he got to the emergency department was somnolent History that was provided was by EMS, patient was stating that he was going to hurt himself.  EMS found his blood sugar too high to detect.  Has no history of underlying diabetes known to anyone Pertinent  Medical History  History reviewed. No pertinent past medical history.   Significant Hospital Events: Including procedures, antibiotic start and stop dates in addition to other pertinent events   03/04/2023-critical care consult for DKA, altered mental status, severe acidemia 03/04/2023-CT head with no acute abnormality 03/04/2023-chest x-ray with no acute abnormality  Interim History / Subjective:  More awake today, able to pass a swallow study. Was able to speak English to the nurses today but remains lethargic.   Objective   Blood pressure (!) 175/104, pulse 72, temperature 98.5 F (36.9 C), temperature source Axillary, resp. rate 11, height 5\' 3"  (1.6 m), weight 70.9 kg, SpO2 99%.        Intake/Output Summary (Last 24 hours) at 03/05/2023 0728 Last data filed at 03/05/2023 0620 Gross per 24 hour  Intake 6825.28 ml  Output 3165 ml  Net 3660.28 ml   Filed Weights   03/04/23 0500  Weight: 70.9 kg    Examination: General: young man lying in bed in NAD HENT: [place]/AT, eyes anicteric Lungs: CTAB, breathing comfortably on RA Cardiovascular:  S1S2, RRR Abdomen: soft, NT Extremities: no cyanosis or edema, no clubbing.  Neuro: sleeping, arouses to verbal stimulation GU: foley with clear urine  K+ 3.2 Bicarb 18, AG 8 BUN 36 Cr 1.78 Beta hydroxy 1.46     Resolved Hospital Problem list     Assessment & Plan:  Severe  diabetic ketoacidosis; severe metabolic acidosis -Unknown history of diabetes, no listed medications to suggest he has diabetes -con't maintenance fluids until PO intake is adequate and off insulin gtt -con't insulin per protocol, con't checking frequent BMPs until off insulin infusion  Acute kidney injury, improving. Mostly likely pre-renal from severe volume contraction -can d/c foley once he can more reliably use a urinal-- need acute I/Os -maintain adequate perfusion -renally dose meds, avoid nephrotoxic meds -strict I/O  Hypokalemia; resolved hyperkalemia -con't K+ repletion, can now add oral -monitor  Acute metabolic encephalopathy due to DKA, severe metabolic acidosis> improving  -avoid deliriogenic meds where able -mobility once he is safe to start  Nausea due to DKA -zofran, phenergan PRN  Suicidal ideation -Psych evaluation later today -needs bedside 1:1 sitter  Best Practice (right click and "Reselect all SmartList Selections" daily)   Diet/type: clear liquids DVT prophylaxis: prophylactic heparin  GI prophylaxis: N/A Lines: N/A Foley:  Yes, and it is still needed Code Status:  full code Last date of multidisciplinary goals of care discussion : no family has been available    Labs   CBC: Recent Labs  Lab 03/04/23 0113 03/04/23 0124  WBC 18.2*  --   NEUTROABS 16.2*  --   HGB 15.3 15.3  HCT 44.7 45.0  MCV 92.5  --   PLT 164  --     Basic Metabolic Panel: Recent Labs  Lab 03/04/23 0850 03/04/23 1307 03/04/23 1654 03/04/23 2107 03/05/23 0116  NA 141 141 143 141 142  K 3.2* 3.4*  3.4* 3.4* 3.2*  CL 113* 113* 115* 115* 115*  CO2 9* 14* 16* 14* 17*  GLUCOSE 357* 240* 223* 213* 207*  BUN 49* 45* 47* 42* 39*  CREATININE 3.54* 2.81* 2.36* 2.10* 2.08*  CALCIUM 8.9 8.8* 8.8* 8.7* 8.8*   GFR: Estimated Creatinine Clearance: 45.1 mL/min (A) (by C-G formula based on SCr of 2.08 mg/dL (H)). Recent Labs  Lab 03/04/23 0113 03/04/23 0119 03/04/23 0533   WBC 18.2*  --   --   LATICACIDVEN  --  1.2 1.5    Steffanie Dunn, DO 03/05/23 10:48 AM [facility]  For contact information, see Amion. If no response to pager, please call PCCM consult pager. After hours, 7PM- 7AM, please call Elink.

## 2023-03-05 NOTE — Inpatient Diabetes Management (Addendum)
Inpatient Diabetes Program Recommendations  AACE/ADA: New Consensus Statement on Inpatient Glycemic Control (2015)  Target Ranges:  Prepandial:   less than 140 mg/dL      Peak postprandial:   less than 180 mg/dL (1-2 hours)      Critically ill patients:  140 - 180 mg/dL   Lab Results  Component Value Date   GLUCAP 141 (H) 03/05/2023   HGBA1C 14.8 (H) 03/04/2023    Review of Glycemic Control  Diabetes history: New-onset DM Outpatient Diabetes medications: N/A Current orders for Inpatient glycemic control: IV insulin per EndoTool for DKA  HgbA1C - 14. 8% Severe metabolic acidosis Ready to transition to SQ insulin CO2 - 18, AG 8, CBG 158  Inpatient Diabetes Program Recommendations:    Consider Semglee 12 units Q24H - continue insulin drip for 1-2H after Semglee is given.  Consider Novolog 0-9 units TID with meals and 0-5 HS  Consider Novolog 4 units TID with meals if eating > 50%  Spoke with pt at bedside regarding new diagnosis of DM. Pt seemed to not comprehend what I was saying to him. Asked pt for his mother's phone number and he couldn't give it to me. Also asked for friends phone number and he was unable to give information. Did state friend's name is Wynona Canes. Has Living Well with Diabetes book and Insulin pen starter kit in room. Still not ready for education.  Will f/u on Monday, August 12th with diabetes education  Will need affordable insulin as pt is uninsured. Needs PCP to manage his diabetes.  Thank you. Ailene Ards, RD, LDN, CDCES Inpatient Diabetes Coordinator [phone]

## 2023-03-06 LAB — GLUCOSE, CAPILLARY
Glucose-Capillary: 107 mg/dL — ABNORMAL HIGH (ref 70–99)
Glucose-Capillary: 115 mg/dL — ABNORMAL HIGH (ref 70–99)
Glucose-Capillary: 125 mg/dL — ABNORMAL HIGH (ref 70–99)
Glucose-Capillary: 139 mg/dL — ABNORMAL HIGH (ref 70–99)
Glucose-Capillary: 142 mg/dL — ABNORMAL HIGH (ref 70–99)
Glucose-Capillary: 147 mg/dL — ABNORMAL HIGH (ref 70–99)
Glucose-Capillary: 147 mg/dL — ABNORMAL HIGH (ref 70–99)
Glucose-Capillary: 152 mg/dL — ABNORMAL HIGH (ref 70–99)
Glucose-Capillary: 175 mg/dL — ABNORMAL HIGH (ref 70–99)
Glucose-Capillary: 181 mg/dL — ABNORMAL HIGH (ref 70–99)
Glucose-Capillary: 208 mg/dL — ABNORMAL HIGH (ref 70–99)
Glucose-Capillary: 218 mg/dL — ABNORMAL HIGH (ref 70–99)
Glucose-Capillary: 359 mg/dL — ABNORMAL HIGH (ref 70–99)
Glucose-Capillary: 72 mg/dL (ref 70–99)
Glucose-Capillary: 93 mg/dL (ref 70–99)

## 2023-03-06 LAB — BASIC METABOLIC PANEL
Anion gap: 12 (ref 5–15)
Anion gap: 10 (ref 5–15)
Anion gap: 9 (ref 5–15)
BUN: 24 mg/dL — ABNORMAL HIGH (ref 6–20)
BUN: 21 mg/dL — ABNORMAL HIGH (ref 6–20)
BUN: 19 mg/dL (ref 6–20)
CO2: 16 mmol/L — ABNORMAL LOW (ref 22–32)
CO2: 18 mmol/L — ABNORMAL LOW (ref 22–32)
CO2: 20 mmol/L — ABNORMAL LOW (ref 22–32)
Calcium: 8.8 mg/dL — ABNORMAL LOW (ref 8.9–10.3)
Calcium: 8.9 mg/dL (ref 8.9–10.3)
Calcium: 8.9 mg/dL (ref 8.9–10.3)
Chloride: 111 mmol/L (ref 98–111)
Chloride: 113 mmol/L — ABNORMAL HIGH (ref 98–111)
Chloride: 109 mmol/L (ref 98–111)
Creatinine, Ser: 1.37 mg/dL — ABNORMAL HIGH (ref 0.61–1.24)
Creatinine, Ser: 1.23 mg/dL (ref 0.61–1.24)
Creatinine, Ser: 1.15 mg/dL (ref 0.61–1.24)
GFR, Estimated: >60 mL/min (ref >60)
GFR, Estimated: >60 mL/min (ref >60)
GFR, Estimated: >60 mL/min (ref >60)
Glucose, Bld: 141 mg/dL — ABNORMAL HIGH (ref 70–99)
Glucose, Bld: 167 mg/dL — ABNORMAL HIGH (ref 70–99)
Glucose, Bld: 199 mg/dL — ABNORMAL HIGH (ref 70–99)
Potassium: 3.1 mmol/L — ABNORMAL LOW (ref 3.5–5.1)
Potassium: 2.9 mmol/L — ABNORMAL LOW (ref 3.5–5.1)
Potassium: 3.2 mmol/L — ABNORMAL LOW (ref 3.5–5.1)
Sodium: 139 mmol/L (ref 135–145)
Sodium: 141 mmol/L (ref 135–145)
Sodium: 138 mmol/L (ref 135–145)

## 2023-03-06 MED ORDER — POTASSIUM CHLORIDE 10 MEQ/100ML IV SOLN
10.0000 meq | INTRAVENOUS | Status: DC
  Administered 2023-03-06: 10 meq via INTRAVENOUS
  Filled 2023-03-06: qty 100

## 2023-03-06 MED ORDER — INSULIN GLARGINE-YFGN 100 UNIT/ML ~~LOC~~ SOLN
12.0000 [IU] | Freq: Every day | SUBCUTANEOUS | Status: DC
  Administered 2023-03-06: 12 [IU] via SUBCUTANEOUS
  Filled 2023-03-06 (×2): qty 0.12

## 2023-03-06 MED ORDER — INSULIN ASPART 100 UNIT/ML IJ SOLN
0.0000 [IU] | Freq: Three times a day (TID) | INTRAMUSCULAR | Status: DC
  Administered 2023-03-06: 3 [IU] via SUBCUTANEOUS
  Administered 2023-03-06: 1 [IU] via SUBCUTANEOUS
  Administered 2023-03-07: 5 [IU] via SUBCUTANEOUS
  Administered 2023-03-07: 3 [IU] via SUBCUTANEOUS
  Administered 2023-03-07: 2 [IU] via SUBCUTANEOUS
  Administered 2023-03-08 (×2): 5 [IU] via SUBCUTANEOUS

## 2023-03-06 MED ORDER — POTASSIUM CHLORIDE 20 MEQ PO PACK
20.0000 meq | PACK | ORAL | Status: AC
  Administered 2023-03-06 (×3): 20 meq via ORAL
  Filled 2023-03-06 (×2): qty 1

## 2023-03-06 MED ORDER — POTASSIUM CHLORIDE 20 MEQ PO PACK: 60.0000 meq | PACK | Freq: Once | ORAL | Status: DC

## 2023-03-06 MED ORDER — ONDANSETRON HCL 4 MG/2ML IJ SOLN
4.0000 mg | Freq: Four times a day (QID) | INTRAMUSCULAR | Status: DC | PRN
  Administered 2023-03-07 – 2023-03-08 (×2): 4 mg via INTRAVENOUS
  Filled 2023-03-06 (×4): qty 2

## 2023-03-06 MED ORDER — INSULIN ASPART 100 UNIT/ML IJ SOLN: 4.0000 [IU] | Freq: Three times a day (TID) | INTRAMUSCULAR | Status: DC

## 2023-03-06 MED ORDER — POTASSIUM CHLORIDE 10 MEQ/100ML IV SOLN: 10.0000 meq | INTRAVENOUS | Status: DC

## 2023-03-06 MED ORDER — INSULIN ASPART 100 UNIT/ML IJ SOLN
0.0000 [IU] | Freq: Every day | INTRAMUSCULAR | Status: DC
  Administered 2023-03-06: 5 [IU] via SUBCUTANEOUS

## 2023-03-06 MED ORDER — POTASSIUM CHLORIDE 20 MEQ PO PACK
40.0000 meq | PACK | ORAL | Status: AC
  Administered 2023-03-06 (×2): 40 meq via ORAL
  Filled 2023-03-06 (×2): qty 2

## 2023-03-06 NOTE — Progress Notes (Signed)
eLink Physician-Brief Progress Note Patient Name: Charles Cooper DOB: [birth year] MRN: [id number]   Date of Service  03/06/2023  HPI/Events of Note  Limited IV access and BSRN says he is able to take PO  eICU Interventions  Shift IV potassium replacement to K packets 40 meqs q 4 x 2 doses     Intervention Category Intermediate Interventions: Electrolyte abnormality - evaluation and management  [name] 03/06/2023, 4:19 AM

## 2023-03-06 NOTE — Plan of Care (Signed)
  Problem: Education: Goal: Ability to describe self-care measures that may prevent or decrease complications (Diabetes Survival Skills Education) will improve Outcome: Progressing   Problem: Coping: Goal: Ability to adjust to condition or change in health will improve Outcome: Progressing   Problem: Fluid Volume: Goal: Ability to maintain a balanced intake and output will improve Outcome: Progressing   

## 2023-03-06 NOTE — Progress Notes (Signed)
Awake and alert today. He denies SI or feelings of wanting to hurt himself. He denies a history of depression or other psychiatric disorders.  Stable to come off of suicide precautions.  No longer needs one-to-one sitter.  Steffanie Dunn, DO 03/06/23 8:09 AM [facility]

## 2023-03-06 NOTE — Progress Notes (Signed)
eLink Physician-Brief Progress Note Patient Name: Charles Cooper DOB: 11-22-1990 MRN: [id number]   Date of Service  03/06/2023  HPI/Events of Note  K 3.1, creatinine 1.37 improving Confused and may not take PO medications  eICU Interventions  Reordered K 10 meqs x 6 doses     Intervention Category Intermediate Interventions: Electrolyte abnormality - evaluation and management  [name] 03/06/2023, 2:48 AM

## 2023-03-06 NOTE — Progress Notes (Signed)
eLink Physician-Brief Progress Note Patient Name: Charles Cooper DOB: Jun 13, 1991 MRN: [id number]   Date of Service  03/06/2023  HPI/Events of Note  Elink to do note stated to follow up on BMP, BMP @ 1850 has K+ of 3.1. He is also endotool and per notes supposed to still be q4hr BMP's while on insulin gtt.  eICU Interventions  Ordered BMP now then q4     Intervention Category Intermediate Interventions: Electrolyte abnormality - evaluation and management  [name] 03/06/2023, 12:31 AM

## 2023-03-06 NOTE — Inpatient Diabetes Management (Addendum)
Inpatient Diabetes Program Recommendations  AACE/ADA: New Consensus Statement on Inpatient Glycemic Control (2015)  Target Ranges:  Prepandial:   less than 140 mg/dL      Peak postprandial:   less than 180 mg/dL (1-2 hours)      Critically ill patients:  140 - 180 mg/dL   Lab Results  Component Value Date   GLUCAP 115 (H) 03/06/2023   HGBA1C 14.8 (H) 03/04/2023    Review of Glycemic Control  Diabetes history: New-onset DM Outpatient Diabetes medications: N/A Current orders for Inpatient glycemic control: IV insulin per EndoTool for DKA  HgbA1C - 14. 8%   Inpatient Diabetes Program Recommendations:    Watch on current SQ regimen, just ordered this am  -  Consider obtaining a c-peptide level to check for insulin levels since pt presented in DKA.  Note Diabetes Coordinator is remote over the weekends, but is available by phone and pager if needs arise. Per DM Coordinator at [facility] yesterday, Coordinator will follow up with pt on Monday, August 12th with diabetes education in person. Nursing staff to start basic education and walk pt through insulin injections and checking CBGs at bedside with each administration time.  Will need affordable insulin as pt is uninsured, would most likely be on [employer] 70/30 unless we can obtain am appointment with one of our clinics for  follow up. Needs PCP to manage his diabetes.  Thanks,  Christena Deem RN, MSN, BC-ADM Inpatient Diabetes Coordinator Team Pager [phone] (8a-5p)

## 2023-03-06 NOTE — Progress Notes (Signed)
NAME:  Stpehen Trojanowski, MRN:  [id number], DOB:  1990/10/18, LOS: 2 ADMISSION DATE:  03/04/2023, CONSULTATION DATE: 03/04/2023 REFERRING MD: Dr. Manus Gunning, CHIEF COMPLAINT: DKA  History of Present Illness:  32 year old gentleman who was brought in for altered mental status Agitated at home, by the time he got to the emergency department was somnolent History that was provided was by EMS, patient was stating that he was going to hurt himself.  EMS found his blood sugar too high to detect.  Has no history of underlying diabetes known to anyone Pertinent  Medical History  History reviewed. No pertinent past medical history.   Significant Hospital Events: Including procedures, antibiotic start and stop dates in addition to other pertinent events   03/04/2023-critical care consult for DKA, altered mental status, severe acidemia 03/04/2023-CT head with no acute abnormality 03/04/2023-chest x-ray with no acute abnormality  Interim History / Subjective:  Mr. Mordecai Maes- Felipa Furnace is feeling improved today.  He has been much more calm and denies thoughts of hurting himself.  Nausea is improving.  He is ready to try eating more.  Objective   Blood pressure 134/78, pulse 71, temperature 99.1 F (37.3 C), temperature source Oral, resp. rate 13, height 5\' 3"  (1.6 m), weight 70.9 kg, SpO2 97%.        Intake/Output Summary (Last 24 hours) at 03/06/2023 0708 Last data filed at 03/06/2023 0344 Gross per 24 hour  Intake 3329.66 ml  Output 3200 ml  Net 129.66 ml   Filed Weights   03/04/23 0500  Weight: 70.9 kg    Examination: General: Young man lying in bed in no acute distress HENT: [place]/AT, eyes anicteric Lungs: Breathing comfortably on room air, CTAB Cardiovascular: S1-S2, regular rate and rhythm Abdomen: Soft, nontender, nondistended Extremities: No cyanosis or edema.  No clubbing. Neuro: Awake and alert, moving all extremities.  Answering questions properly.  Normal speech. Derm: Skin warm,  dry.  No diffuse rashes.  K+ 2.7 Bicarb 18, AG 8 BUN 24 Cr 1.28     Resolved Hospital Problem list     Assessment & Plan:  Severe diabetic ketoacidosis; severe metabolic acidosis.  Diabetes is a new diagnosis. -Transition off insulin infusion today to basal bolus insulin.  Insulin regimen per recommendations of diabetes educator-Semglee 12 units daily with overlap of the drip.  NovoLog 4 units 3 times daily AC.  Sliding scale insulin. -Goal blood glucose 140-180 - Discussed that he will have to leave the hospital on insulin and we will have to train him on checking blood sugars, administering insulin.  Discussed with the nurse to start this education today. -Will make referral for endocrinology evaluation as an outpatient at discharge.  Acute kidney injury, improving. Mostly likely pre-renal from severe volume contraction NAGMA -Maintain adequate perfusion - Strict I's/O - Renally dose meds and avoid nephrotoxic meds  Hypokalemia; resolved hyperkalemia -Aggressive K+ repletion, recheck this afternoon  Acute metabolic encephalopathy due to DKA, severe metabolic acidosis> resolved -Avoid deliriogenic medications - Progressive mobility.  Discussed with the nurse to start walking in the unit today  Nausea due to DKA -Advance diet as tolerated. - Zofran and Phenergan as needed  Suicidal ideation> was situational with acute encephalopathy.  Not persistent, does not sound like this has been a chronic issue for him -No longer need suicide precautions or one-to-one sitter.  No need for psychiatric evaluation unless concerns arise.  Best Practice (right click and "Reselect all SmartList Selections" daily)   Diet/type: Regular consistency (see orders) DVT prophylaxis: prophylactic heparin  GI prophylaxis: N/A Lines: N/A Foley:  N/A Code Status:  full code Last date of multidisciplinary goals of care discussion : no family has been available    Labs   CBC: Recent Labs  Lab  03/04/23 0113 03/04/23 0124  WBC 18.2*  --   NEUTROABS 16.2*  --   HGB 15.3 15.3  HCT 44.7 45.0  MCV 92.5  --   PLT 164  --     Basic Metabolic Panel: Recent Labs  Lab 03/05/23 0116 03/05/23 0739 03/05/23 1850 03/06/23 0149 03/06/23 0430  NA 142 143 138 139 140  K 3.2* 3.2* 3.1* 3.1* 2.7*  CL 115* 117* 113* 111 114*  CO2 17* 18* 19* 16* 18*  GLUCOSE 207* 158* 146* 141* 166*  BUN 39* 36* 29* 24* 24*  CREATININE 2.08* 1.78* 1.42* 1.37* 1.28*  CALCIUM 8.8* 9.0 8.5* 8.8* 8.5*  MG  --  2.5*  --   --  2.6*   GFR: Estimated Creatinine Clearance: 73.2 mL/min (A) (by C-G formula based on SCr of 1.28 mg/dL (H)). Recent Labs  Lab 03/04/23 0113 03/04/23 0119 03/04/23 0533  WBC 18.2*  --   --   LATICACIDVEN  --  1.2 1.5    Steffanie Dunn, DO 03/06/23 8:14 AM [facility]  For contact information, see Amion. If no response to pager, please call PCCM consult pager. After hours, 7PM- 7AM, please call Elink.

## 2023-03-07 DIAGNOSIS — R112 Nausea with vomiting, unspecified: Secondary | ICD-10-CM

## 2023-03-07 LAB — BASIC METABOLIC PANEL WITH GFR
Anion gap: 7 (ref 5–15)
BUN: 17 mg/dL (ref 6–20)
CO2: 22 mmol/L (ref 22–32)
Calcium: 9.1 mg/dL (ref 8.9–10.3)
Chloride: 111 mmol/L (ref 98–111)
Creatinine, Ser: 1.13 mg/dL (ref 0.61–1.24)
GFR, Estimated: >60 mL/min (ref >60)
Glucose, Bld: 169 mg/dL — ABNORMAL HIGH (ref 70–99)
Potassium: 3.3 mmol/L — ABNORMAL LOW (ref 3.5–5.1)
Sodium: 140 mmol/L (ref 135–145)

## 2023-03-07 LAB — GLUCOSE, CAPILLARY: Glucose-Capillary: 198 mg/dL — ABNORMAL HIGH (ref 70–99)

## 2023-03-07 MED ORDER — LOSARTAN POTASSIUM 50 MG PO TABS
25.0000 mg | ORAL_TABLET | Freq: Every day | ORAL | Status: DC
  Administered 2023-03-07 – 2023-03-08 (×2): 25 mg via ORAL
  Filled 2023-03-07 (×2): qty 1

## 2023-03-07 MED ORDER — MELATONIN 5 MG PO TABS
5.0000 mg | ORAL_TABLET | Freq: Every evening | ORAL | Status: DC | PRN
  Administered 2023-03-07: 5 mg via ORAL
  Filled 2023-03-07: qty 1

## 2023-03-07 MED ORDER — HYDROXYZINE HCL 25 MG PO TABS: 25.0000 mg | ORAL_TABLET | Freq: Three times a day (TID) | ORAL | Status: DC | PRN

## 2023-03-07 MED ORDER — INSULIN GLARGINE-YFGN 100 UNIT/ML ~~LOC~~ SOLN
16.0000 [IU] | Freq: Every day | SUBCUTANEOUS | Status: DC
  Administered 2023-03-07: 16 [IU] via SUBCUTANEOUS
  Filled 2023-03-07 (×2): qty 0.16

## 2023-03-07 MED ORDER — POTASSIUM CHLORIDE CRYS ER 20 MEQ PO TBCR
40.0000 meq | EXTENDED_RELEASE_TABLET | Freq: Once | ORAL | Status: AC
  Administered 2023-03-07: 40 meq via ORAL
  Filled 2023-03-07: qty 2

## 2023-03-07 MED ORDER — POTASSIUM CHLORIDE 20 MEQ PO PACK
40.0000 meq | PACK | ORAL | Status: AC
  Administered 2023-03-07 (×2): 40 meq
  Filled 2023-03-07 (×3): qty 2

## 2023-03-07 MED ORDER — POTASSIUM CHLORIDE 10 MEQ/100ML IV SOLN
10.0000 meq | INTRAVENOUS | Status: AC
  Administered 2023-03-07 (×4): 10 meq via INTRAVENOUS
  Filled 2023-03-07 (×3): qty 100

## 2023-03-07 MED ORDER — IBUPROFEN 200 MG PO TABS: 400.0000 mg | ORAL_TABLET | Freq: Three times a day (TID) | ORAL | Status: DC | PRN

## 2023-03-07 MED ORDER — POTASSIUM CHLORIDE CRYS ER 20 MEQ PO TBCR
40.0000 meq | EXTENDED_RELEASE_TABLET | ORAL | Status: DC
  Administered 2023-03-07: 40 meq via ORAL
  Filled 2023-03-07: qty 2

## 2023-03-07 MED ORDER — INSULIN GLARGINE-YFGN 100 UNIT/ML ~~LOC~~ SOLN
18.0000 [IU] | Freq: Every day | SUBCUTANEOUS | Status: DC
  Filled 2023-03-07: qty 0.18

## 2023-03-07 MED ORDER — INSULIN ASPART 100 UNIT/ML IJ SOLN
6.0000 [IU] | Freq: Three times a day (TID) | INTRAMUSCULAR | Status: DC
  Administered 2023-03-07 (×3): 6 [IU] via SUBCUTANEOUS

## 2023-03-07 MED ORDER — ACETAMINOPHEN 325 MG PO TABS
650.0000 mg | ORAL_TABLET | Freq: Four times a day (QID) | ORAL | Status: DC | PRN
  Administered 2023-03-07: 650 mg via ORAL
  Filled 2023-03-07: qty 2

## 2023-03-07 MED ORDER — POTASSIUM CHLORIDE CRYS ER 20 MEQ PO TBCR
20.0000 meq | EXTENDED_RELEASE_TABLET | ORAL | Status: DC
  Administered 2023-03-07: 20 meq via ORAL
  Filled 2023-03-07: qty 1

## 2023-03-07 NOTE — Plan of Care (Signed)
  Problem: Education: Goal: Ability to describe self-care measures that may prevent or decrease complications (Diabetes Survival Skills Education) will improve Outcome: Progressing   Problem: Coping: Goal: Ability to adjust to condition or change in health will improve Outcome: Progressing   Problem: Health Behavior/Discharge Planning: Goal: Ability to identify and utilize available resources and services will improve Outcome: Not Progressing

## 2023-03-07 NOTE — Progress Notes (Signed)
[facility] ADULT ICU REPLACEMENT PROTOCOL   The patient does apply for the [facility] Adult ICU Electrolyte Replacment Protocol based on the criteria listed below:   1.Exclusion criteria: TCTS, ECMO, Dialysis, and Myasthenia Gravis patients 2. Is GFR >/= 30 ml/min? Yes.    Patient's GFR today is >60 3. Is SCr </= 2? Yes.   Patient's SCr is 1.13 mg/dL 4. Did SCr increase >/= 0.5 in 24 hours? No. 5.Pt's weight >40kg  Yes.   6. Abnormal electrolyte(s): K  7. Electrolytes replaced per protocol 8.  Call MD STAT for K+ </= 2.5, Phos </= 1, or Mag </= 1 Physician:  Flossie Buffy E Merritt Mccravy 03/07/2023 5:34 AM

## 2023-03-07 NOTE — Progress Notes (Addendum)
NAME:  Charles Cooper, MRN:  [id number], DOB:  06-16-1991, LOS: 3 ADMISSION DATE:  03/04/2023, CONSULTATION DATE: 03/04/2023 REFERRING MD: Dr. Manus Gunning, CHIEF COMPLAINT: DKA  History of Present Illness:  32 year old gentleman who was brought in for altered mental status Agitated at home, by the time he got to the emergency department was somnolent History that was provided was by EMS, patient was stating that he was going to hurt himself.  EMS found his blood sugar too high to detect.  Has no history of underlying diabetes known to anyone. Pertinent  Medical History  History reviewed. No pertinent past medical history.   Significant Hospital Events: Including procedures, antibiotic start and stop dates in addition to other pertinent events   03/04/2023-critical care consult for DKA, altered mental status, severe acidemia 03/04/2023-CT head with no acute abnormality 8/10 transitioned off insulin infusion  Interim History / Subjective:  Still having some nausea--seems to be mostly associated with taking oral potassium.  He complains of his feet feeling tight and having some numbness.  Objective   Blood pressure 134/78, pulse 62, temperature 98.7 F (37.1 C), temperature source Oral, resp. rate 13, height 5\' 3"  (1.6 m), weight 70.9 kg, SpO2 98%.        Intake/Output Summary (Last 24 hours) at 03/07/2023 0732 Last data filed at 03/07/2023 5284 Gross per 24 hour  Intake 4658.77 ml  Output 1440 ml  Net 3218.77 ml   Filed Weights   03/04/23 0500  Weight: 70.9 kg    Examination: General: Young man lying in bed no acute distress, sitting up eating breakfast HENT: [place]/AT, eyes anicteric Lungs: Breathing comfortably on room air, CTAB Cardiovascular: S1-S2, regular rate and rhythm Abdomen: Soft, nontender, nondistended Extremities: No clubbing, no cyanosis.  Feet examined-no wounds, warm. Neuro: Awake and alert, answering questions appropriately, Derm: Skin warm and dry, no  diffuse rashes  K+ 3.3 Bicarb 22 BUN 17 Cr 1.13   Resolved Hospital Problem list   Acute metabolic encephalopathy due to DKA, severe metabolic acidosis> resolved AKI  Assessment & Plan:  Severe diabetic ketoacidosis; severe metabolic acidosis.  Diabetes is a new diagnosis.  Uncontrolled hyperglycemia overnight - Increase basal insulin to 16 units daily - Increase mealtime insulin to 6 units 3 times daily AC with hold parameters if he is not eating - Sliding scale insulin as needed - Goal blood glucose 140-180 - Have encouraged nurses to allow him to start doing his own Accu-Cheks and injecting insulin with her supervision since he needs to be trained on this before he can go home and do this himself. -Will make referral for endocrinology evaluation as an outpatient at discharge. -She does not have health insurance, will need to clarify an appropriate insulin regimen with diabetes coordinators.  Hypokalemia; resolved hyperkalemia - RASS of potassium repletion-switched back to dissolvable potassium which she has tolerated previously.  Hypertension - Start losartan 25 mg daily - Would help potassium as well  Nausea due to DKA initially, now due to meds -Switch to non- pill form of potassium - Antiemetics as needed; Zofran and Phenergan ordered  Suicidal ideation> was situational with acute encephalopathy.  Not persistent, does not sound like this has been a chronic issue for him - No follow-up required  Hopefully will be able to discharge home tomorrow.  Best Practice (right click and "Reselect all SmartList Selections" daily)   Diet/type: Regular consistency (see orders) DVT prophylaxis: prophylactic heparin  GI prophylaxis: N/A Lines: N/A Foley:  N/A Code Status:  full code  Last date of multidisciplinary goals of care discussion : no family has been available    Labs   CBC: Recent Labs  Lab 03/04/23 0113 03/04/23 0124  WBC 18.2*  --   NEUTROABS 16.2*  --   HGB  15.3 15.3  HCT 44.7 45.0  MCV 92.5  --   PLT 164  --     Basic Metabolic Panel: Recent Labs  Lab 03/05/23 0739 03/05/23 1850 03/06/23 0149 03/06/23 0430 03/06/23 1011 03/06/23 1427 03/07/23 0259  NA 143   < > 139 140 141 138 140  K 3.2*   < > 3.1* 2.7* 2.9* 3.2* 3.3*  CL 117*   < > 111 114* 113* 109 111  CO2 18*   < > 16* 18* 18* 20* 22  GLUCOSE 158*   < > 141* 166* 167* 199* 169*  BUN 36*   < > 24* 24* 21* 19 17  CREATININE 1.78*   < > 1.37* 1.28* 1.23 1.15 1.13  CALCIUM 9.0   < > 8.8* 8.5* 8.9 8.9 9.1  MG 2.5*  --   --  2.6*  --   --   --    < > = values in this interval not displayed.   GFR: Estimated Creatinine Clearance: 83 mL/min (by C-G formula based on SCr of 1.13 mg/dL). Recent Labs  Lab 03/04/23 0113 03/04/23 0119 03/04/23 0533  WBC 18.2*  --   --   LATICACIDVEN  --  1.2 1.5    Steffanie Dunn, DO 03/07/23 9:30 AM [facility]  For contact information, see Amion. If no response to pager, please call PCCM consult pager. After hours, 7PM- 7AM, please call Elink.

## 2023-03-08 ENCOUNTER — Other Ambulatory Visit: Payer: Self-pay

## 2023-03-08 DIAGNOSIS — I152 Hypertension secondary to endocrine disorders: Secondary | ICD-10-CM

## 2023-03-08 DIAGNOSIS — E875 Hyperkalemia: Secondary | ICD-10-CM

## 2023-03-08 LAB — BASIC METABOLIC PANEL WITH GFR
Anion gap: 7 (ref 5–15)
BUN: 13 mg/dL (ref 6–20)
CO2: 25 mmol/L (ref 22–32)
Calcium: 9.2 mg/dL (ref 8.9–10.3)
Chloride: 103 mmol/L (ref 98–111)
Creatinine, Ser: 0.96 mg/dL (ref 0.61–1.24)
GFR, Estimated: >60 mL/min
Glucose, Bld: 264 mg/dL — ABNORMAL HIGH (ref 70–99)
Potassium: 3.9 mmol/L (ref 3.5–5.1)
Sodium: 135 mmol/L (ref 135–145)

## 2023-03-08 LAB — GLUCOSE, CAPILLARY
Glucose-Capillary: 295 mg/dL — ABNORMAL HIGH (ref 70–99)
Glucose-Capillary: 278 mg/dL — ABNORMAL HIGH (ref 70–99)

## 2023-03-08 MED ORDER — INSULIN STARTER KIT- PEN NEEDLES (SPANISH): 1.0000 | Freq: Once | 0 refills | Status: AC

## 2023-03-08 MED ORDER — INSULIN LISPRO PROT & LISPRO (75-25 MIX) 100 UNIT/ML KWIKPEN
18.0000 [IU] | PEN_INJECTOR | Freq: Two times a day (BID) | SUBCUTANEOUS | 0 refills | Status: DC
  Filled 2023-03-08: qty 15, 42d supply, fill #0

## 2023-03-08 MED ORDER — ONDANSETRON HCL 4 MG PO TABS: 4.0000 mg | ORAL_TABLET | Freq: Three times a day (TID) | ORAL | 0 refills | Status: DC | PRN

## 2023-03-08 MED ORDER — INSULIN GLARGINE-YFGN 100 UNIT/ML ~~LOC~~ SOLN
22.0000 [IU] | Freq: Every day | SUBCUTANEOUS | Status: DC
  Administered 2023-03-08: 22 [IU] via SUBCUTANEOUS
  Filled 2023-03-08: qty 0.22

## 2023-03-08 MED ORDER — LOSARTAN POTASSIUM 25 MG PO TABS: 25.0000 mg | ORAL_TABLET | Freq: Every day | ORAL | 0 refills | Status: DC

## 2023-03-08 MED ORDER — INSULIN ASPART 100 UNIT/ML IJ SOLN
8.0000 [IU] | Freq: Three times a day (TID) | INTRAMUSCULAR | Status: DC
  Administered 2023-03-08 (×2): 8 [IU] via SUBCUTANEOUS

## 2023-03-08 NOTE — Discharge Summary (Signed)
Physician Discharge Summary       Patient ID: Charles Cooper MRN: [id number] DOB/AGE: [birth year] 32 y.o.  Admit date: 03/04/2023 Discharge date: 03/08/2023  Discharge Diagnoses:   Severe diabetic ketoacidosis Severe metabolic acidosis Type 1 diabetes  Uncontrolled hyperglycemia  Hypokalemia; resolved hyperkalemia Hypertension Nausea due to DKA initially, now due to meds Suicidal ideation> was situational with acute encephalopathy.   Discharge summary   32 year old gentleman who was brought in for altered mental status, agitated at home.By the time he got to the emergency department he was somnolent History that was provided was by EMS, patient was stating that he was going to hurt himself.  EMS found his blood sugar too high to detect.   Has no history of underlying diabetes known to anyone.  On ED arrival patient was seen with tachycardia, tachypnea, and hypertensive. Lab work significant for Na 128, K 6.4, glycose 913, creatinine 5.91, and WBC 18.2. ABG < 6.95, CO2 28, and bicarb 4.9. Patient was diagnosised with severe DKA, PCCM consulted for further management and admission.   During admission patient was treated with IV insulin and aggressive IV hydration with resolution of DKA and AKI. SSI insulin regiment was started for ongoing management for likely new diagnosis of type 1 diabetes. Diabetes coordinator was consulted and reccommended insulin regiment of 70/30 at time of discharge. Patient educated on need for and proper administration of subcutaneous insulin prior to discharge. TOC ws consulted for assistance in establishing with a PCP. On 8/12 patient remains stable for discharge.    Discharge Plan by Active Problems   Severe diabetic ketoacidosis Severe metabolic acidosis Type 1 diabetes  P: Diabetes coordinator evaluated and educated patient prior to discharge  70/30 insulin 18 uinits BID at discharge  Will need PCP and endocrinology follow ups at discharge     Hypokalemia; resolved hyperkalemia P: Supplemented during admission and resolved   Hypertension P: Continue Losartan 25 mgs daily at discharge    Nausea due to DKA initially P: Antiemetics as needed    Suicidal ideation  -Was situational with acute encephalopathy.  Not persistent, does not sound like this has been a chronic issue for him P: No outpatient follow up    Significant Hospital tests/ studies  Head CT negative   Procedures   None   Culture data/antimicrobials   None    Consults      Discharge Exam: BP 138/80 (BP Location: Right Arm)   Pulse 67   Temp 98 F (36.7 C)   Resp 18   Ht 5\' 3"  (1.6 m)   Wt 70.9 kg   SpO2 98%   BMI 27.69 kg/m   General: Well appearing adult male sitting up in bed, in NAD HEENT: [place]/AT, MM pink/moist, PERRL,  Neuro: Alert and oriented x3, non-focal  CV: s1s2 regular rate and rhythm, no murmur, rubs, or gallops,  PULM:  Clear to auscultation, no increased work of breathing, no added breath sounds  GI: soft, bowel sounds active in all 4 quadrants, non-tender, non-distended, tolerating oral deit Extremities: warm/dry, no edema  Skin: no rashes or lesions  Labs at discharge   Lab Results  Component Value Date   CREATININE 0.96 03/08/2023   BUN 13 03/08/2023   NA 135 03/08/2023   K 3.9 03/08/2023   CL 103 03/08/2023   CO2 25 03/08/2023   Lab Results  Component Value Date   WBC 18.2 (H) 03/04/2023   HGB 15.3 03/04/2023   HCT 45.0 03/04/2023   MCV 92.5  03/04/2023   PLT 164 03/04/2023   Lab Results  Component Value Date   ALT 22 05/20/2011   AST 26 05/20/2011   ALKPHOS 62 05/20/2011   BILITOT 0.9 05/20/2011   No results found for: "INR", "PROTIME"  Current radiological studies    No results found.  Disposition:      Discharge Instructions     Ambulatory referral to Endocrinology   Complete by: As directed        Allergies as of 03/08/2023   No Known Allergies      Medication List      TAKE these medications    acetaminophen 500 MG tablet Commonly known as: TYLENOL Take 500 mg by mouth every 6 (six) hours as needed for moderate pain.   Insulin Aspart FlexPen 100 UNIT/ML Commonly known as: NOVOLOG Inject 18 Units into the skin 2 (two) times daily with a meal.   insulin starter kit- pen needles Misc 1 kit by Other route once for 1 dose.   losartan 25 MG tablet Commonly known as: COZAAR Take 1 tablet (25 mg total) by mouth daily.   ondansetron 4 MG tablet Commonly known as: Zofran Take 1 tablet (4 mg total) by mouth every 8 (eight) hours as needed for nausea or vomiting.         Follow-up appointment   PCP  Endocrinology   Discharge Condition:    good   Signed: Nattalie Santiesteban D. Harris, NP-C [facility] Personal contact information can be found on Amion  If no contact or response made please call [phone] 03/08/2023, 2:24 PM

## 2023-03-08 NOTE — Inpatient Diabetes Management (Signed)
Inpatient Diabetes Program Recommendations  AACE/ADA: New Consensus Statement on Inpatient Glycemic Control (2015)  Target Ranges:  Prepandial:   less than 140 mg/dL      Peak postprandial:   less than 180 mg/dL (1-2 hours)      Critically ill patients:  140 - 180 mg/dL   Lab Results  Component Value Date   GLUCAP 295 (H) 03/08/2023   HGBA1C 14.8 (H) 03/04/2023    Review of Glycemic Control  Diabetes history: New-onset DM Outpatient Diabetes medications: None Current orders for Inpatient glycemic control: Semglee 22 every day, Novolog 0-9 units TID with meals and 0-5 HS + 8 units TID  HgbA1C - 14.8% ( average blood sugar approx 380 mg/dL)  Inpatient Diabetes Program Recommendations:    Discharge Recommendations: Intermediate acting recommendations: insulin isophane & regular (NOVOLIN 70/30 RELION PEN) ([facility] Only) 18 units BID (before breakfast and before dinner  Supply/Referral recommendations: Glucometer Test strips Lancet device Lancets Pen needles - standard   Use Adult Diabetes Insulin Treatment Post Discharge order set.  Spoke with pt and sister at bedside regarding new diagnosis of DM. Discussed HgbA1C of 14.8% and glucose and HgbA1C goals. Discussed basic pathophysiology of DM Type 2, basic home care, importance of checking CBGs and maintaining good CBG control to prevent long-term and short-term complications. Reviewed glucose and A1C goals and explained that patient will need to continue to  Reviewed signs and symptoms of hyperglycemia and hypoglycemia along with treatment for both. Discussed impact of nutrition, exercise, stress, sickness, and medications on diabetes control. Reviewed Living Well with diabetes booklet and encouraged patient to read through entire book. Informed patient that he will be prescribed Novolin 70/30 since it is more affordable. Informed patient that Novolin 70/30 can be purchased at [facility] for $44 for 5 pens. Discussed 70/30 insulin in  detail (how to take it, when to take it) and instructed patient he would begin taking it today with supper. Asked patient to check his glucose at least 3-4 times per day (before meals and at bedtime) and to keep a log book of glucose readings and insulin taken. Explained how the doctor he follows up with can use the log book to continue to make insulin adjustments if needed.  Educated patient and sister on insulin pen use at home. Reviewed contents of insulin flexpen starter kit. Reviewed all steps if insulin pen including attachment of needle, 2-unit air shot, dialing up dose, giving injection, removing needle, disposal of sharps, storage of unused insulin, disposal of insulin etc. Patient able to provide successful return demonstration. Also reviewed troubleshooting with insulin pen. MD to give patient Rxs for insulin pens and insulin pen needles. Patient verbalized understanding of information discussed and he states that he has no further questions at this time related to diabetes.   Ordered TOC consult for PCP needs and medication assistance. Pt is uninsured and sister will be helping pt with costs of insulin and supplies. Discussed above with RN.  Thank you. Ailene Ards, RD, LDN, CDCES Inpatient Diabetes Coordinator [phone]

## 2023-03-08 NOTE — Care Plan (Signed)
Discharge education provided. Pt A&O, VSS, IV's removed. Pt ready for discharge. Declines wheelchair, would like to walk out of the hospital.

## 2023-03-08 NOTE — Discharge Instructions (Signed)
You're insulin was sent to: [facility] at [facility] [address], [place], [place] [address] Closes 6?PM Phone: [phone]  The Losartan (blood pressure medication) and Zofran (nausea medication) was sent to  [facility]  [address], [place], [place] [address] Closes 11?PM Phone: [phone]  If you are unable to afford your insulin in the future you can buy it OTC (no prescription) at [facility] for $24

## 2023-03-08 NOTE — Progress Notes (Signed)
NAME:  Charles Cooper, MRN:  [id number], DOB:  06/14/1991, LOS: 4 ADMISSION DATE:  03/04/2023, CONSULTATION DATE: 03/04/2023 REFERRING MD: Dr. Manus Gunning, CHIEF COMPLAINT: DKA  History of Present Illness:  32 year old gentleman who was brought in for altered mental status Agitated at home, by the time he got to the emergency department was somnolent History that was provided was by EMS, patient was stating that he was going to hurt himself.  EMS found his blood sugar too high to detect.  Has no history of underlying diabetes known to anyone. Pertinent  Medical History  History reviewed. No pertinent past medical history.   Significant Hospital Events: Including procedures, antibiotic start and stop dates in addition to other pertinent events   03/04/2023-critical care consult for DKA, altered mental status, severe acidemia 03/04/2023-CT head with no acute abnormality 8/10 transitioned off insulin infusion  Interim History / Subjective:  He still has nausea before eating, but no vomiting.  BG remain high, 44 units of insulin yesterday total.   Objective   Blood pressure 112/77, pulse 67, temperature 98 F (36.7 C), resp. rate 18, height 5\' 3"  (1.6 m), weight 70.9 kg, SpO2 98%.        Intake/Output Summary (Last 24 hours) at 03/08/2023 0716 Last data filed at 03/07/2023 2100 Gross per 24 hour  Intake 780.05 ml  Output 3195 ml  Net -2414.95 ml   Filed Weights   03/04/23 0500  Weight: 70.9 kg    Examination: General: young man lying in bed in NAD HENT: [place]/AT, eyes anicteric Lungs: breathing comfortably on RA, CTAB Cardiovascular: S1S2, RRR Abdomen: soft, NT Extremities: no edema Neuro: awake, alert, moving all extremities Derm: warm, dry, no rashes  K+ 3.9 Bicarb 25 BUN 13 Cr 0.96   Resolved Hospital Problem list   Acute metabolic encephalopathy due to DKA, severe metabolic acidosis> resolved AKI Hyperkalemia & hypokalemia  Assessment & Plan:  Severe  diabetic ketoacidosis; severe metabolic acidosis.  Diabetes is a new diagnosis.  Uncontrolled hyperglycemia overnight - Increase basal insulin to 20 units daily - Increase mealtime insulin to 8 units 3 times daily AC with hold parameters if he is not eating. Per Diabetes educator, likely to need to switch to 70-30 at discharge due to lack of insurance - SSI PRN -goal BG 140-180 - Con't doing his own accuchecks and administering insulin himself with nursing supervision as training Endocrinology referral.  -sister at bedside this morning; she will be there this morning when Diabetes educator is there to do teaching.   Hypertension - con't  losartan 25 mg daily- needs to be discharged on this  Nausea due to DKA initially, now possibly due to meds -zofran PRN  Hopefully will be able to discharge home later today with OP endocrinology follow up. Needs to be established with a PCP. High risk for difficulty with compliance without an established PCP and no insurance.   Best Practice (right click and "Reselect all SmartList Selections" daily)   Diet/type: Regular consistency (see orders) DVT prophylaxis: prophylactic heparin  GI prophylaxis: N/A Lines: N/A Foley:  N/A Code Status:  full code Last date of multidisciplinary goals of care discussion : no family has been available    Labs   CBC: Recent Labs  Lab 03/04/23 0113 03/04/23 0124  WBC 18.2*  --   NEUTROABS 16.2*  --   HGB 15.3 15.3  HCT 44.7 45.0  MCV 92.5  --   PLT 164  --     Basic Metabolic Panel: Recent  Labs  Lab 03/05/23 0739 03/05/23 1850 03/06/23 0430 03/06/23 1011 03/06/23 1427 03/07/23 0259 03/08/23 0538  NA 143   < > 140 141 138 140 135  K 3.2*   < > 2.7* 2.9* 3.2* 3.3* 3.9  CL 117*   < > 114* 113* 109 111 103  CO2 18*   < > 18* 18* 20* 22 25  GLUCOSE 158*   < > 166* 167* 199* 169* 264*  BUN 36*   < > 24* 21* 19 17 13   CREATININE 1.78*   < > 1.28* 1.23 1.15 1.13 0.96  CALCIUM 9.0   < > 8.5* 8.9 8.9  9.1 9.2  MG 2.5*  --  2.6*  --   --   --   --    < > = values in this interval not displayed.   GFR: Estimated Creatinine Clearance: 97.7 mL/min (by C-G formula based on SCr of 0.96 mg/dL). Recent Labs  Lab 03/04/23 0113 03/04/23 0119 03/04/23 0533  WBC 18.2*  --   --   LATICACIDVEN  --  1.2 1.5    Steffanie Dunn, DO 03/08/23 9:21 AM [facility]  For contact information, see Amion. If no response to pager, please call PCCM consult pager. After hours, 7PM- 7AM, please call Elink.

## 2023-03-08 NOTE — TOC Initial Note (Addendum)
Transition of Care [facility]) - Initial/Assessment Note    Patient Details  Name: Charles Cooper MRN: [id number] Date of Birth: September 02, 1990  Transition of Care [facility]) CM/SW Contact:    Otelia Santee, LCSW Phone Number: 03/08/2023, 10:22 AM  Clinical Narrative:                 Pt w/ new dx of diabetes. Pt has no insurance and has not been followed by a PCP.  Pt has a PCP appointment at [facility] on 03/24/23 at 3:00pm. Information has been placed on pt's f/u.   ADDENDUM: Met with pt and provided resources for financial assistance, orange card application, and patient assistance for ongoing medication through [facility] pharmacy.   Expected Discharge Plan: Home/Self Care Barriers to Discharge: No Barriers Identified   Patient Goals and CMS Choice Patient states their goals for this hospitalization and ongoing recovery are:: To return home CMS Medicare.gov Compare Post Acute Care list provided to:: Patient Choice offered to / list presented to : Patient [place] ownership interest in [facility].provided to:: Patient    Expected Discharge Plan and Services In-house Referral: Clinical Social Work Discharge Planning Services: [facility] Post Acute Care Choice: NA Living arrangements for the past 2 months: Single Family Home                 DME Arranged: N/A DME Agency: NA                  Prior Living Arrangements/Services Living arrangements for the past 2 months: Single Family Home Lives with:: Self Patient language and need for interpreter reviewed:: Yes Do you feel safe going back to the place where you live?: Yes      Need for Family Participation in Patient Care: No (Comment) Care giver support system in place?: No (comment)   Criminal Activity/Legal Involvement Pertinent to Current Situation/Hospitalization: No - Comment as needed  Activities of Daily Living Home Assistive Devices/Equipment: None ADL Screening (condition  at time of admission) Patient's cognitive ability adequate to safely complete daily activities?: Yes Is the patient deaf or have difficulty hearing?: No Does the patient have difficulty seeing, even when wearing glasses/contacts?: No Does the patient have difficulty concentrating, remembering, or making decisions?: No Patient able to express need for assistance with ADLs?: Yes Does the patient have difficulty dressing or bathing?: No Independently performs ADLs?: Yes (appropriate for developmental age) Does the patient have difficulty walking or climbing stairs?: No Weakness of Legs: None Weakness of Arms/Hands: None  Permission Sought/Granted   Permission granted to share information with : No              Emotional Assessment Appearance:: Appears stated age Attitude/Demeanor/Rapport: Engaged Affect (typically observed): Accepting Orientation: : Oriented to Self, Oriented to Place, Oriented to  Time, Oriented to Situation Alcohol / Substance Use: Not Applicable Psych Involvement: No (comment)  Admission diagnosis:  Hyperkalemia [E87.5] Metabolic acidosis [E87.20] DKA (diabetic ketoacidosis) (HCC) [E11.10] Diabetic ketoacidosis with coma associated with type 2 diabetes mellitus (HCC) [E11.11] Patient Active Problem List   Diagnosis Date Noted   Hyperkalemia 03/08/2023   Hypertension associated with diabetes (HCC) 03/08/2023   DKA (diabetic ketoacidosis) (HCC) 03/04/2023   PCP:  Patient, No Pcp Per Pharmacy:   [id number] - [place], [place] - [address] AT [facility] OF [address] [place] [address] Phone: [phone] Fax: [phone]     Social Determinants  of Health (SDOH) Social History: SDOH Screenings   Food Insecurity: Patient Unable To Answer (03/04/2023)  Housing: Patient Unable To Answer (03/04/2023)  Transportation Needs: Patient Unable To Answer (03/04/2023)  Utilities: Patient Unable To Answer  (03/04/2023)  Tobacco Use: High Risk (03/04/2023)   SDOH Interventions:     Readmission Risk Interventions    03/08/2023   10:20 AM  Readmission Risk Prevention Plan  Post Dischage Appt Complete  Medication Screening Complete  Transportation Screening Complete

## 2023-03-09 ENCOUNTER — Other Ambulatory Visit: Payer: Self-pay

## 2023-03-09 MED ORDER — INSULIN PEN NEEDLE 32G X 4 MM MISC
Freq: Two times a day (BID) | 0 refills | Status: DC
  Filled 2023-03-09: qty 100, 50d supply, fill #0

## 2023-03-11 ENCOUNTER — Other Ambulatory Visit: Payer: Self-pay

## 2023-03-24 ENCOUNTER — Other Ambulatory Visit: Payer: Self-pay | Admitting: Student

## 2023-03-24 ENCOUNTER — Other Ambulatory Visit (HOSPITAL_COMMUNITY): Payer: Self-pay

## 2023-03-24 ENCOUNTER — Ambulatory Visit (INDEPENDENT_AMBULATORY_CARE_PROVIDER_SITE_OTHER): Payer: Self-pay | Admitting: Student

## 2023-03-24 ENCOUNTER — Encounter: Payer: Self-pay | Admitting: Student

## 2023-03-24 VITALS — BP 118/65 | HR 73 | Temp 98.4°F | Ht 63.0 in | Wt 173.4 lb

## 2023-03-24 DIAGNOSIS — F17210 Nicotine dependence, cigarettes, uncomplicated: Secondary | ICD-10-CM

## 2023-03-24 DIAGNOSIS — I10 Essential (primary) hypertension: Secondary | ICD-10-CM

## 2023-03-24 DIAGNOSIS — E1159 Type 2 diabetes mellitus with other circulatory complications: Secondary | ICD-10-CM

## 2023-03-24 DIAGNOSIS — E1365 Other specified diabetes mellitus with hyperglycemia: Secondary | ICD-10-CM

## 2023-03-24 DIAGNOSIS — Z794 Long term (current) use of insulin: Secondary | ICD-10-CM

## 2023-03-24 DIAGNOSIS — R110 Nausea: Secondary | ICD-10-CM

## 2023-03-24 LAB — GLUCOSE, CAPILLARY: Glucose-Capillary: 151 mg/dL — ABNORMAL HIGH (ref 70–99)

## 2023-03-24 MED ORDER — LOSARTAN POTASSIUM 25 MG PO TABS
25.0000 mg | ORAL_TABLET | Freq: Every day | ORAL | 6 refills | Status: DC
  Filled 2023-03-24: qty 30, 30d supply, fill #0
  Filled 2023-04-15: qty 30, 30d supply, fill #1
  Filled 2023-06-02: qty 30, 30d supply, fill #2
  Filled 2023-06-24: qty 30, 30d supply, fill #3

## 2023-03-24 MED ORDER — ONDANSETRON HCL 4 MG PO TABS
4.0000 mg | ORAL_TABLET | Freq: Three times a day (TID) | ORAL | 0 refills | Status: DC | PRN
  Filled 2023-03-24: qty 30, 10d supply, fill #0

## 2023-03-24 MED ORDER — INSULIN GLARGINE 100 UNIT/ML SOLOSTAR PEN
22.0000 [IU] | PEN_INJECTOR | Freq: Every day | SUBCUTANEOUS | 3 refills | Status: DC
  Filled 2023-03-24: qty 6, 27d supply, fill #0
  Filled 2023-04-15: qty 6, 27d supply, fill #1
  Filled 2023-05-12: qty 6, 27d supply, fill #2
  Filled 2023-06-02: qty 6, 27d supply, fill #3
  Filled 2023-06-24: qty 6, 27d supply, fill #4

## 2023-03-24 NOTE — Progress Notes (Addendum)
CC: Hospital follow-up  HPI: [name] is a 32 y.o. male living with a history stated below and presents today for hospital follow-up. Please see problem based assessment and plan for additional details.  Past Medical History:  Diagnosis Date   Other specified diabetes mellitus with hyperglycemia (HCC) 03/25/2023    Current Outpatient Medications on File Prior to Visit  Medication Sig Dispense Refill   acetaminophen (TYLENOL) 500 MG tablet Take 500 mg by mouth every 6 (six) hours as needed for moderate pain.     Insulin Pen Needle 32G X 4 MM MISC Inject into the skin 2 (two) times daily. 100 each 0   No current facility-administered medications on file prior to visit.    No family history on file.  Social History   Socioeconomic History   Marital status: Single    Spouse name: Not on file   Number of children: Not on file   Years of education: Not on file   Highest education level: Not on file  Occupational History   Not on file  Tobacco Use   Smoking status: Every Day    Current packs/day: 0.50    Types: Cigarettes   Smokeless tobacco: Never  Substance and Sexual Activity   Alcohol use: No   Drug use: Yes    Types: Marijuana   Sexual activity: Not on file  Other Topics Concern   Not on file  Social History Narrative   Not on file   Social Determinants of Health   Financial Resource Strain: Not on file  Food Insecurity: Patient Unable To Answer (03/04/2023)   Hunger Vital Sign    Worried About Running Out of Food in the Last Year: Patient unable to answer    Ran Out of Food in the Last Year: Patient unable to answer  Transportation Needs: Patient Unable To Answer (03/04/2023)   PRAPARE - Transportation    Lack of Transportation (Medical): Patient unable to answer    Lack of Transportation (Non-Medical): Patient unable to answer  Physical Activity: Not on file  Stress: Not on file  Social Connections: Not on file  Intimate Partner Violence:  Patient Unable To Answer (03/04/2023)   Humiliation, Afraid, Rape, and Kick questionnaire    Fear of Current or Ex-Partner: Patient unable to answer    Emotionally Abused: Patient unable to answer    Physically Abused: Patient unable to answer    Sexually Abused: Patient unable to answer    Review of Systems: ROS negative except for what is noted on the assessment and plan.  Vitals:   03/24/23 1531  BP: 118/65  Pulse: 73  Temp: 98.4 F (36.9 C)  TempSrc: Oral  SpO2: 99%  Weight: 173 lb 6.4 oz (78.7 kg)  Height: 5\' 3"  (1.6 m)    Physical Exam: Constitutional: well-appearing, no acute distress HENT: normocephalic atraumatic, mucous membranes moist Eyes: conjunctiva non-erythematous Cardiovascular: regular rate and rhythm, no m/r/g Pulmonary/Chest: normal work of breathing on room air, lungs clear to auscultation bilaterally Abdominal: soft, non-tender, non-distended MSK: normal bulk and tone Neurological: alert & oriented x 3, no focal deficit Skin: warm and dry Psych: normal mood and behavior  Assessment & Plan:    Patient seen with Dr. Sol Blazing  Other specified diabetes mellitus with hyperglycemia  Patient was discharged on 03/08/2023 and was found to be severely hyperglycemic.  He was discharged with 75/25 insulin 18 units twice daily.  His A1c was 14.8% 2 weeks ago.  He has been checking his  sugars 1 times per day.  He was confused with his medication regimen and was taking 9 units 4 times per day before meals.  He had 1 episode of hyperglycemia on the first day of the hospital where his level was roughly 400.  A few days later, he was hypoglycemic at 70.  He notes that these episodes are due to confusion with taking his medications but he has not experienced a similar episode since then.  His levels since then have been between 120 and 150.  His level today was 151.  He denies any nausea or vomiting but notes occasional acid reflux.  He met with the diabetes educator regarding  about appropriately managing his diet and when to take his current meds.  He was diagnosed with type 1 diabetes in the hospital but etiology remains unclear. It is unclear if this is type I or type 2 diabetes and I believe the patient should follow-up with additional labs and future visits to confirm this diagnosis. He did present with DKA but the age of onset is slightly greater than would be expected for new onset type 1 diabetes. He is uninsured so we are holding off on running any additional labs or following up with consultants.  He received information regarding the orange card and low cost medication at the outpatient pharmacy.  We stopped his 75/25 insulin to begin once per day glargine.  He will need to follow-up with ophthalmology, kidney function panel, and a BMP once insurance goes through. - Stop Humalog 75/25 - Begin glargine 22 units/day - Follow-up in 2 weeks to check medication  Hypertension Patient was discharged with losartan 25 mg.  Patient notes that he has been taking this every day.  He denies any chest pain or shortness of breath. - Continue losartan 25 mg  Morrie Sheldon, MD  [facility] Internal Medicine, PGY-1 Phone: [phone] Date 03/25/2023 Time 8:49 AM

## 2023-03-24 NOTE — Progress Notes (Signed)
After-hours call from this patient.  He was seen today but did not get a refill on his Zofran which she thought he was getting get.  Reviewing his on the EKG his QTc was 442.  He uses Zofran 4 mg twice a day which helps for nausea that occurs before he eats.  He is supposed to follow-up again in our clinic in 2 weeks.  I will refill his Zofran for 2 weeks but recommend that he gets an EKG at his next appointment to ensure no QT issues.  Rocky Morel, DO Internal Medicine Resident, PGY-2 Pager# [phone] Please contact the on call pager after 5 pm and on weekends at [phone].

## 2023-03-24 NOTE — Patient Instructions (Addendum)
Thank you so much for coming to the clinic today!   Today, we addressed your diabetes medications. You will *STOP taking the Humalog 75-25 and *START taking the insulin glargine 22 units once per day. Please continue to check your glucose levels and maintain a healthy diet.   Please complete the paperwork given to you. Contact Jenene Slicker to help you with this process. We will see you again in roughly two weeks to check in on your glucose levels.   If you have any questions please feel free to the call the clinic at anytime at [phone]. It was a pleasure seeing you!  Best, Dr. Rayvon Char

## 2023-03-25 ENCOUNTER — Other Ambulatory Visit (HOSPITAL_COMMUNITY): Payer: Self-pay

## 2023-03-25 ENCOUNTER — Telehealth: Payer: Self-pay | Admitting: Dietician

## 2023-03-25 ENCOUNTER — Encounter: Payer: Self-pay | Admitting: Student

## 2023-03-25 DIAGNOSIS — E119 Type 2 diabetes mellitus without complications: Secondary | ICD-10-CM | POA: Insufficient documentation

## 2023-03-25 DIAGNOSIS — E1365 Other specified diabetes mellitus with hyperglycemia: Secondary | ICD-10-CM | POA: Insufficient documentation

## 2023-03-25 NOTE — Telephone Encounter (Signed)
Call to follow up with Charles Cooper about his new diabetes. He is asking questions about his new insulin, not as hungry with it and when to take it. I helped him put Librelink on his phone and start using his free sample Freestylelibre 14 day sensor. He agreed to call for questions or concerns.

## 2023-03-25 NOTE — Assessment & Plan Note (Signed)
Patient was discharged with losartan 25 mg.  Patient notes that he has been taking this every day.  He denies any chest pain or shortness of breath. - Continue losartan 25 mg

## 2023-03-26 NOTE — Addendum Note (Signed)
Addended by: Dickie La on: 03/26/2023 10:52 AM   Modules accepted: Level of Service

## 2023-03-26 NOTE — Progress Notes (Addendum)
Internal Medicine Clinic Attending  I was physically present during the key portions of the resident provided service and participated in the medical decision making of patient's management care. I reviewed pertinent patient test results.  The assessment, diagnosis, and plan were formulated together and I agree with the documentation in the resident's note.  Recently hospitalized with DKA in the setting of new diagnosis of DM. Patient phenotype seems more consistent with type 2 DM, however given severe DKA at presentation and young age I would recommend we obtain antibodies at future visit once insurance coverage is obtained. Orange card/CAFA paperwork provided today. 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 Transition from 75/25 insulin to once daily glargine. We will schedule close f/u to continue DM education and monitor BG. Mr. Caramanica will need referral for eye exam as well as lab work at future visit.  Dickie La, MD

## 2023-04-15 ENCOUNTER — Encounter: Payer: Self-pay | Admitting: Student

## 2023-04-15 ENCOUNTER — Other Ambulatory Visit (HOSPITAL_COMMUNITY): Payer: Self-pay

## 2023-04-15 ENCOUNTER — Ambulatory Visit (INDEPENDENT_AMBULATORY_CARE_PROVIDER_SITE_OTHER): Payer: Self-pay | Admitting: Student

## 2023-04-15 ENCOUNTER — Other Ambulatory Visit: Payer: Self-pay

## 2023-04-15 VITALS — BP 104/53 | HR 68 | Temp 98.1°F | Ht 67.0 in | Wt 182.3 lb

## 2023-04-15 DIAGNOSIS — E1169 Type 2 diabetes mellitus with other specified complication: Secondary | ICD-10-CM

## 2023-04-15 DIAGNOSIS — E1365 Other specified diabetes mellitus with hyperglycemia: Secondary | ICD-10-CM

## 2023-04-15 DIAGNOSIS — Z0000 Encounter for general adult medical examination without abnormal findings: Secondary | ICD-10-CM | POA: Insufficient documentation

## 2023-04-15 DIAGNOSIS — Z794 Long term (current) use of insulin: Secondary | ICD-10-CM

## 2023-04-15 MED ORDER — INSULIN PEN NEEDLE 32G X 4 MM MISC
1.0000 | Freq: Two times a day (BID) | 12 refills | Status: DC
Start: 2023-04-15 — End: 2023-09-14
  Filled 2023-04-15: qty 100, 50d supply, fill #0
  Filled 2023-07-19: qty 100, 50d supply, fill #1

## 2023-04-15 NOTE — Progress Notes (Signed)
CC: diabetes follow-up  HPI: Charles Cooper is a 32 y.o. male living with a history stated below and presents today for diabetes follow-up. Please see problem based assessment and plan for additional details.  Past Medical History:  Diagnosis Date   Other specified diabetes mellitus with hyperglycemia (HCC) 03/25/2023    Current Outpatient Medications on File Prior to Visit  Medication Sig Dispense Refill   acetaminophen (TYLENOL) 500 MG tablet Take 500 mg by mouth every 6 (six) hours as needed for moderate pain.     insulin glargine (LANTUS) 100 UNIT/ML Solostar Pen Inject 22 Units into the skin daily. 15 mL 3   losartan (COZAAR) 25 MG tablet Take 1 tablet (25 mg total) by mouth daily. 30 tablet 6   ondansetron (ZOFRAN) 4 MG tablet Take 1 tablet (4 mg total) by mouth every 8 (eight) hours as needed for nausea or vomiting. 30 tablet 0   No current facility-administered medications on file prior to visit.    No family history on file.  Social History   Socioeconomic History   Marital status: Single    Spouse name: Not on file   Number of children: Not on file   Years of education: Not on file   Highest education level: Not on file  Occupational History   Not on file  Tobacco Use   Smoking status: Former    Current packs/day: 0.50    Types: Cigarettes   Smokeless tobacco: Never  Substance and Sexual Activity   Alcohol use: No   Drug use: Yes    Types: Marijuana   Sexual activity: Not on file  Other Topics Concern   Not on file  Social History Narrative   Not on file   Social Determinants of Health   Financial Resource Strain: Not on file  Food Insecurity: Patient Unable To Answer (03/04/2023)   Hunger Vital Sign    Worried About Running Out of Food in the Last Year: Patient unable to answer    Ran Out of Food in the Last Year: Patient unable to answer  Transportation Needs: Patient Unable To Answer (03/04/2023)   PRAPARE - Transportation    Lack of  Transportation (Medical): Patient unable to answer    Lack of Transportation (Non-Medical): Patient unable to answer  Physical Activity: Not on file  Stress: Not on file  Social Connections: Not on file  Intimate Partner Violence: Patient Unable To Answer (03/04/2023)   Humiliation, Afraid, Rape, and Kick questionnaire    Fear of Current or Ex-Partner: Patient unable to answer    Emotionally Abused: Patient unable to answer    Physically Abused: Patient unable to answer    Sexually Abused: Patient unable to answer    Review of Systems: ROS negative except for what is noted on the assessment and plan.  Vitals:   04/15/23 1400  BP: (!) 104/53  Pulse: 68  Temp: 98.1 F (36.7 C)  TempSrc: Oral  SpO2: 98%  Weight: 182 lb 4.8 oz (82.7 kg)  Height: 5\' 7"  (1.702 m)    Physical Exam: Constitutional: well-appearing in no acute distress HENT: normocephalic atraumatic, mucous membranes moist Eyes: conjunctiva non-erythematous Neck: supple Cardiovascular: regular rate and rhythm, no m/r/g Pulmonary/Chest: normal work of breathing on room air, lungs clear to auscultation bilaterally Abdominal: soft, non-tender, non-distended MSK: normal bulk and tone Neurological: alert & oriented x 3, 5/5 strength in bilateral upper and lower extremities, normal gait Skin: warm and dry  Assessment & Plan:   Diabetes (  [facility]) Patient was last seen in the clinic on 03/24/2023.  He had been hospitalized previously for severe hyperglycemia with an A1c of 14.8%.  At last visit, he has been checking his sugar 1 time per day.  There was confusion regarding his medication regimen in which he was experiencing hypoglycemia and hyperglycemia events.  At last visit, we stopped his 75/25 insulin and begin him on once per day glargine.  We also provided him with orange card paperwork so he can follow-up with ophthalmology and receive a BMP to evaluate his kidney function.  Patient notes that he has been a bit more fatigued  but overall his glucose levels have remained steady with no signs or symptoms of highs or lows.  He does have some elevated values around 200 around 10 PM at night that it is likely secondary to nighttime snacking.  Patient says that he eats snacks such as a corn dog or dark chocolate before sleep.  Encouraged him to minimize his intake in foods that are high in sugar and carbs as these are most likely contributing to these episodes.  It is still unclear if his diabetes is type I or type II in nature as his glucose levels have remained stable with long-acting insulin.  We will further work this up as the patient is waiting on his Medicaid in which the status is currently pending. - Continue glargine 22 units/day - Follow-up in 2 months for repeat A1c  Healthcare maintenance Denies flu shot today   Patient seen with Dr. Georgena Spurling, MD  [facility] Internal Medicine, PGY-1 Date 04/15/2023 Time 3:09 PM

## 2023-04-15 NOTE — Assessment & Plan Note (Signed)
Denies flu shot today

## 2023-04-15 NOTE — Assessment & Plan Note (Addendum)
Patient was last seen in the clinic on 03/24/2023.  He had been hospitalized previously for severe hyperglycemia with an A1c of 14.8%.  At last visit, he has been checking his sugar 1 time per day.  There was confusion regarding his medication regimen in which he was experiencing hypoglycemia and hyperglycemia events.  At last visit, we stopped his 75/25 insulin and begin him on once per day glargine.  We also provided him with orange card paperwork so he can follow-up with ophthalmology and receive a BMP to evaluate his kidney function.  Patient notes that he has been a bit more fatigued but overall his glucose levels have remained steady with no signs or symptoms of highs or lows.  He does have some elevated values around 200 around 10 PM at night that it is likely secondary to nighttime snacking.  Patient says that he eats snacks such as a corn dog or dark chocolate before sleep.  Encouraged him to minimize his intake in foods that are high in sugar and carbs as these are most likely contributing to these episodes.  It is still unclear if his diabetes is type I or type II in nature as his glucose levels have remained stable with long-acting insulin.  We will further work this up as the patient is waiting on his Medicaid in which the status is currently pending. - Continue glargine 22 units/day - Follow-up in 2 months for repeat A1c

## 2023-04-15 NOTE — Patient Instructions (Signed)
Thank you so much for coming to the clinic today!   Please continue to take your insulin daily and continue to monitor your glucose levels. We will see you again in two months.   If you have any questions please feel free to the call the clinic at anytime at [phone]. It was a pleasure seeing you!  Best, Dr. Rayvon Char

## 2023-04-16 NOTE — Progress Notes (Signed)
Internal Medicine Clinic Attending  I was physically present during the key portions of the resident provided service and participated in the medical decision making of patient's management care. I reviewed pertinent patient test results.  The assessment, diagnosis, and plan were formulated together and I agree with the documentation in the resident's note.  New diagnosis of diabetes, unclear type, but is ketosis prone. He is doing well on long acting insulin only, his blood sugars on glucometer look in range through the day. I anticipate the A1c is going to come down nicely at next visit. I don't see a need to add mealtime insulin based on these readings. At next visit, may be able to try metformin as I suspect he may have a genetic variant of type 2 or MODY. When he gets insurance coverage, we can investigate further with insulin and antibody levels. Could also refer to [facility] endocrinology where they have access to genetic testing for uncommon diabetes variants.   Erlinda Hong, MD FACP

## 2023-05-12 ENCOUNTER — Other Ambulatory Visit (HOSPITAL_COMMUNITY): Payer: Self-pay

## 2023-06-02 ENCOUNTER — Other Ambulatory Visit (HOSPITAL_COMMUNITY): Payer: Self-pay

## 2023-06-25 ENCOUNTER — Other Ambulatory Visit (HOSPITAL_COMMUNITY): Payer: Self-pay

## 2023-07-19 ENCOUNTER — Other Ambulatory Visit (HOSPITAL_COMMUNITY): Payer: Self-pay

## 2023-07-19 ENCOUNTER — Other Ambulatory Visit: Payer: Self-pay

## 2023-07-19 ENCOUNTER — Encounter: Payer: Self-pay | Admitting: Internal Medicine

## 2023-07-19 ENCOUNTER — Ambulatory Visit (INDEPENDENT_AMBULATORY_CARE_PROVIDER_SITE_OTHER): Payer: Self-pay | Admitting: Internal Medicine

## 2023-07-19 VITALS — BP 125/76 | HR 60 | Temp 97.8°F | Wt 192.0 lb

## 2023-07-19 DIAGNOSIS — E1365 Other specified diabetes mellitus with hyperglycemia: Secondary | ICD-10-CM

## 2023-07-19 DIAGNOSIS — I152 Hypertension secondary to endocrine disorders: Secondary | ICD-10-CM

## 2023-07-19 DIAGNOSIS — Z794 Long term (current) use of insulin: Secondary | ICD-10-CM

## 2023-07-19 DIAGNOSIS — E1159 Type 2 diabetes mellitus with other circulatory complications: Secondary | ICD-10-CM

## 2023-07-19 DIAGNOSIS — Z0000 Encounter for general adult medical examination without abnormal findings: Secondary | ICD-10-CM

## 2023-07-19 DIAGNOSIS — I10 Essential (primary) hypertension: Secondary | ICD-10-CM

## 2023-07-19 DIAGNOSIS — E1369 Other specified diabetes mellitus with other specified complication: Secondary | ICD-10-CM

## 2023-07-19 LAB — POCT GLYCOSYLATED HEMOGLOBIN (HGB A1C): Hemoglobin A1C: 6.1 % — AB (ref 4.0–5.6)

## 2023-07-19 LAB — GLUCOSE, CAPILLARY: Glucose-Capillary: 109 mg/dL — ABNORMAL HIGH (ref 70–99)

## 2023-07-19 MED ORDER — METFORMIN HCL ER 500 MG PO TB24
500.0000 mg | ORAL_TABLET | ORAL | 0 refills | Status: DC
Start: 2023-07-19 — End: 2023-08-16
  Filled 2023-07-19: qty 70, 30d supply, fill #0

## 2023-07-19 MED ORDER — LOSARTAN POTASSIUM 50 MG PO TABS
50.0000 mg | ORAL_TABLET | Freq: Every day | ORAL | 6 refills | Status: DC
  Filled 2023-07-19: qty 30, 30d supply, fill #0

## 2023-07-19 MED ORDER — INSULIN GLARGINE 100 UNIT/ML SOLOSTAR PEN
18.0000 [IU] | PEN_INJECTOR | Freq: Every day | SUBCUTANEOUS | Status: DC
Start: 2023-07-19 — End: 2023-07-19

## 2023-07-19 MED ORDER — INSULIN GLARGINE 100 UNIT/ML SOLOSTAR PEN
18.0000 [IU] | PEN_INJECTOR | Freq: Every day | SUBCUTANEOUS | 0 refills | Status: DC
Start: 2023-07-19 — End: 2023-09-14
  Filled 2023-07-19 (×2): qty 15, 83d supply, fill #0

## 2023-07-19 MED ORDER — LOSARTAN POTASSIUM 25 MG PO TABS
25.0000 mg | ORAL_TABLET | Freq: Every day | ORAL | 6 refills | Status: DC
  Filled 2023-07-19: qty 30, 30d supply, fill #0
  Filled 2023-08-16: qty 30, 30d supply, fill #1

## 2023-07-19 NOTE — Patient Instructions (Addendum)
[name], it was a pleasure seeing you today! You endorsed feeling well today. Below are some of the things we talked about this visit. We look forward to seeing you in the follow up appointment!  Today we discussed: You're diabetes is doing great! Keep up the good work.   We will start you on metformin. Decrease your insulin to 18 units daily and continue to decrease it weekly until your your morning sugars are around 120-140.   I will increase your losartan to 50 mg daily.   We will check lab work today.   I have ordered the following labs today:  Lab Orders         Glucose, capillary         Microalbumin / Creatinine Urine Ratio         POC Hbg A1C       Referrals ordered today:   Referral Orders  No referral(s) requested today     I have ordered the following medication/changed the following medications:   Stop the following medications: Medications Discontinued During This Encounter  Medication Reason   ondansetron (ZOFRAN) 4 MG tablet Patient has not taken in last 30 days   insulin glargine (LANTUS) 100 UNIT/ML Solostar Pen Reorder   losartan (COZAAR) 25 MG tablet Reorder     Start the following medications: Meds ordered this encounter  Medications   metFORMIN (GLUCOPHAGE-XR) 500 MG 24 hr tablet    Sig: Take 1 tablet (500 mg total) by mouth See admin instructions. For the first week: take 1 tablet once a day. For the second week: take 1 tablet in the AM and 1 tablet at the evening. For the third week: take 2 tablets (total of 1000 mg) in the AM and 1 tablet at the evening. For the forth week: take 2 tablets (total of 1000 mg) in the AM and 2 tablets (total of 1000 mg) at the evening.    Dispense:  70 tablet    Refill:  0   losartan (COZAAR) 50 MG tablet    Sig: Take 1 tablet (50 mg total) by mouth daily for 30 doses.    Dispense:  30 tablet    Refill:  6    IM Program   insulin glargine (LANTUS) 100 UNIT/ML Solostar Pen    Sig: Inject 18 Units  into the skin daily.    IM Program     Follow-up: 1 month follow up high blood pressure follow up   Please make sure to arrive 15 minutes prior to your next appointment. If you arrive late, you may be asked to reschedule.   We look forward to seeing you next time. Please call our clinic at [phone] if you have any questions or concerns. The best time to call is Monday-Friday from 9am-4pm, but there is someone available 24/7. If after hours or the weekend, call the main hospital number and ask for the Internal Medicine Resident On-Call. If you need medication refills, please notify your pharmacy one week in advance and they will send [place] a request.  Thank you for letting us take part in your care. Wishing you the best!  Thank you, Gwenevere Abbot, MD

## 2023-07-19 NOTE — Progress Notes (Unsigned)
   CC: DMII follow up  HPI:  [name] is a 32 y.o. with medical history of DMII, HTN,  presenting to [facility] for diabetes follow up.   Please see problem-based list for further details, assessments, and plans.  Past Medical History:  Diagnosis Date   Other specified diabetes mellitus with hyperglycemia (HCC) 03/25/2023    Current Outpatient Medications (Endocrine & Metabolic):    insulin glargine (LANTUS) 100 UNIT/ML Solostar Pen, Inject 22 Units into the skin daily.  Current Outpatient Medications (Cardiovascular):    losartan (COZAAR) 25 MG tablet, Take 1 tablet (25 mg total) by mouth daily.   Current Outpatient Medications (Analgesics):    acetaminophen (TYLENOL) 500 MG tablet, Take 500 mg by mouth every 6 (six) hours as needed for moderate pain.   Current Outpatient Medications (Other):    Insulin Pen Needle 32G X 4 MM MISC, use into the skin 2 (two) times daily.   ondansetron (ZOFRAN) 4 MG tablet, Take 1 tablet (4 mg total) by mouth every 8 (eight) hours as needed for nausea or vomiting.  Review of Systems:  Review of system negative unless stated in the problem list or HPI.    Physical Exam:  There were no vitals filed for this visit. Physical Exam General: NAD HENT: NCAT Lungs: CTAB, no wheeze, rhonchi or rales.  Cardiovascular: Normal heart sounds, no r/m/g, 2+ pulses in all extremities. No LE edema Abdomen: No TTP, normal bowel sounds MSK: No asymmetry or muscle atrophy.  Skin: no lesions noted on exposed skin Neuro: Alert and oriented x4. CN grossly intact Psych: Normal mood and normal affect   Assessment & Plan:   No problem-specific Assessment & Plan notes found for this encounter.   See Encounters Tab for problem based charting.  Patient Discussed with Dr. Mauri Pole, MD Eligha Bridegroom. [facility] Internal Medicine Residency, PGY-3   DMII On insulin glargine 22 units daily. A1c this visit. Decrease to 18  units   Care Gaps Foot exam ACR Hep C

## 2023-07-20 LAB — MICROALBUMIN / CREATININE URINE RATIO
Creatinine, Urine: 117.8 mg/dL
Microalb/Creat Ratio: 22 mg/g{creat} (ref 0–29)
Microalbumin, Urine: 25.9 ug/mL

## 2023-07-21 NOTE — Assessment & Plan Note (Signed)
BP well controlled with losartan 25 mg daily. Will keep more for nephro-protection.

## 2023-07-21 NOTE — Assessment & Plan Note (Signed)
Pt's with unspecified diabetes. At last visit pt with elevated A1c around 15 % started on insulin glargine 22 units. He has noted good glucose control with this. A1c this visit was 6.1 %. Discussed starting metformin with the patient and pt is agreeable. Will also decrease his insulin to 18 units. Advised pt to titrate his metformin to 1000 mg BID gradually. As we are increasing his metformin we will decrease his insulin gradually by 1 unit until fasting sugars are 120-140. Pt understanding and was able to teach back these instruction. Follow up in 3 months.

## 2023-07-21 NOTE — Assessment & Plan Note (Addendum)
ACR done this visit. Foot exam done this visit.

## 2023-07-22 NOTE — Progress Notes (Signed)
 Internal Medicine Clinic Attending  Case discussed with the resident physician at the time of the visit.  We reviewed the patient's history, exam, and pertinent patient test results.  I agree with the assessment, diagnosis, and plan of care documented in the resident's note.

## 2023-08-01 ENCOUNTER — Telehealth: Payer: Self-pay | Admitting: Student

## 2023-08-01 NOTE — Telephone Encounter (Signed)
 Fielded call from patient about diabetes. He reports blood sugar of 160s after eating pizza. Normalized within a few hours. He is on insulin  and metformin , the former being titrated down as metformin  is being increased. He said he felt weird for a moment but he's better now. Sounds well on the phone. No changes, reassured that he is at goal.  Ozell Kung MD 08/01/2023, 7:21 PM

## 2023-08-16 ENCOUNTER — Other Ambulatory Visit: Payer: Self-pay | Admitting: Internal Medicine

## 2023-08-16 ENCOUNTER — Other Ambulatory Visit (HOSPITAL_COMMUNITY): Payer: Self-pay

## 2023-08-16 ENCOUNTER — Other Ambulatory Visit: Payer: Self-pay

## 2023-08-16 DIAGNOSIS — Z794 Long term (current) use of insulin: Secondary | ICD-10-CM

## 2023-08-17 ENCOUNTER — Other Ambulatory Visit (HOSPITAL_COMMUNITY): Payer: Self-pay

## 2023-08-17 MED ORDER — METFORMIN HCL ER 500 MG PO TB24
500.0000 mg | ORAL_TABLET | ORAL | 0 refills | Status: DC
  Filled 2023-08-17: qty 70, 28d supply, fill #0

## 2023-08-18 ENCOUNTER — Other Ambulatory Visit (HOSPITAL_COMMUNITY): Payer: Self-pay

## 2023-09-07 ENCOUNTER — Encounter: Payer: Self-pay | Admitting: Student

## 2023-09-14 ENCOUNTER — Other Ambulatory Visit (HOSPITAL_COMMUNITY): Payer: Self-pay

## 2023-09-14 ENCOUNTER — Ambulatory Visit: Payer: Self-pay | Admitting: Student

## 2023-09-14 ENCOUNTER — Other Ambulatory Visit: Payer: Self-pay

## 2023-09-14 VITALS — BP 131/72 | HR 64 | Temp 98.1°F | Ht 67.0 in | Wt 195.4 lb

## 2023-09-14 DIAGNOSIS — E1369 Other specified diabetes mellitus with other specified complication: Secondary | ICD-10-CM

## 2023-09-14 DIAGNOSIS — I152 Hypertension secondary to endocrine disorders: Secondary | ICD-10-CM

## 2023-09-14 DIAGNOSIS — E1365 Other specified diabetes mellitus with hyperglycemia: Secondary | ICD-10-CM

## 2023-09-14 DIAGNOSIS — E1159 Type 2 diabetes mellitus with other circulatory complications: Secondary | ICD-10-CM

## 2023-09-14 DIAGNOSIS — Z7984 Long term (current) use of oral hypoglycemic drugs: Secondary | ICD-10-CM

## 2023-09-14 DIAGNOSIS — I10 Essential (primary) hypertension: Secondary | ICD-10-CM

## 2023-09-14 DIAGNOSIS — Z794 Long term (current) use of insulin: Secondary | ICD-10-CM

## 2023-09-14 MED ORDER — BASAGLAR KWIKPEN 100 UNIT/ML ~~LOC~~ SOPN
18.0000 [IU] | PEN_INJECTOR | Freq: Every day | SUBCUTANEOUS | 3 refills | Status: DC
  Filled 2023-09-14: qty 15, 83d supply, fill #0
  Filled 2023-10-28 – 2023-12-17 (×3): qty 15, 83d supply, fill #1
  Filled 2024-02-23: qty 15, 83d supply, fill #2
  Filled 2024-02-23: qty 6, 33d supply, fill #2
  Filled 2024-03-30 (×2): qty 6, 33d supply, fill #3
  Filled 2024-04-22: qty 6, 33d supply, fill #4
  Filled 2024-04-29 – 2024-05-18 (×2): qty 6, 33d supply, fill #0
  Filled 2024-05-18 (×2): qty 6, 33d supply, fill #1
  Filled 2024-05-18 – 2024-05-29 (×3): qty 6, 33d supply, fill #0
  Filled 2024-06-26 – 2024-06-27 (×2): qty 6, 33d supply, fill #1

## 2023-09-14 MED ORDER — BASAGLAR KWIKPEN 100 UNIT/ML ~~LOC~~ SOPN
18.0000 [IU] | PEN_INJECTOR | Freq: Every day | SUBCUTANEOUS | 3 refills | Status: DC
  Filled 2023-09-14: qty 15, 83d supply, fill #0

## 2023-09-14 MED ORDER — INSULIN GLARGINE 100 UNIT/ML SOLOSTAR PEN
18.0000 [IU] | PEN_INJECTOR | Freq: Every day | SUBCUTANEOUS | 3 refills | Status: DC
  Filled 2023-09-14: qty 15, 83d supply, fill #0

## 2023-09-14 MED ORDER — LOSARTAN POTASSIUM 25 MG PO TABS
25.0000 mg | ORAL_TABLET | Freq: Every day | ORAL | 3 refills | Status: DC
  Filled 2023-09-14 – 2023-10-28 (×3): qty 30, 30d supply, fill #0
  Filled 2023-10-28: qty 30, 30d supply, fill #1
  Filled 2023-10-28: qty 30, 30d supply, fill #0
  Filled 2023-11-26 – 2023-11-27 (×2): qty 30, 30d supply, fill #1
  Filled 2023-12-17 (×3): qty 30, 30d supply, fill #2
  Filled 2024-01-28: qty 30, 30d supply, fill #3
  Filled 2024-02-23 (×2): qty 30, 30d supply, fill #4
  Filled 2024-03-30 (×2): qty 30, 30d supply, fill #5
  Filled 2024-04-22: qty 30, 30d supply, fill #6
  Filled 2024-04-29: qty 30, 30d supply, fill #0
  Filled 2024-05-27: qty 30, 30d supply, fill #1
  Filled 2024-05-29: qty 30, 30d supply, fill #0
  Filled 2024-06-26 – 2024-06-27 (×2): qty 30, 30d supply, fill #1
  Filled 2024-07-26: qty 30, 30d supply, fill #2

## 2023-09-14 MED ORDER — METFORMIN HCL 1000 MG PO TABS
1000.0000 mg | ORAL_TABLET | Freq: Two times a day (BID) | ORAL | 3 refills | Status: AC
Start: 2023-09-14 — End: ?
  Filled 2023-09-14: qty 60, 30d supply, fill #0
  Filled 2023-11-13 – 2023-11-15 (×2): qty 60, 30d supply, fill #1

## 2023-09-14 MED ORDER — INSULIN PEN NEEDLE 32G X 4 MM MISC
1.0000 | Freq: Two times a day (BID) | 12 refills | Status: AC
  Filled 2023-09-14: qty 100, 50d supply, fill #0
  Filled 2023-11-13 – 2023-11-27 (×2): qty 100, 50d supply, fill #1
  Filled 2024-01-28: qty 100, 50d supply, fill #2
  Filled 2024-07-26: qty 100, 50d supply, fill #3

## 2023-09-14 NOTE — Progress Notes (Unsigned)
CC: Follow-up  HPI:  [name] is a 33 y.o. male living with a history stated below and presents today for follow-up. Please see problem based assessment and plan for additional details.  Past Medical History:  Diagnosis Date   DKA (diabetic ketoacidosis) (HCC) 03/04/2023   Other specified diabetes mellitus with hyperglycemia (HCC) 03/25/2023    Current Outpatient Medications on File Prior to Visit  Medication Sig Dispense Refill   acetaminophen (TYLENOL) 500 MG tablet Take 500 mg by mouth every 6 (six) hours as needed for moderate pain.     No current facility-administered medications on file prior to visit.    No family history on file.  Social History   Socioeconomic History   Marital status: Single    Spouse name: Not on file   Number of children: Not on file   Years of education: Not on file   Highest education level: Not on file  Occupational History   Not on file  Tobacco Use   Smoking status: Former    Current packs/day: 0.50    Types: Cigarettes   Smokeless tobacco: Never  Substance and Sexual Activity   Alcohol use: No   Drug use: Yes    Types: Marijuana   Sexual activity: Not on file  Other Topics Concern   Not on file  Social History Narrative   Not on file   Social Drivers of Health   Financial Resource Strain: Not on file  Food Insecurity: Patient Unable To Answer (03/04/2023)   Hunger Vital Sign    Worried About Running Out of Food in the Last Year: Patient unable to answer    Ran Out of Food in the Last Year: Patient unable to answer  Transportation Needs: Patient Unable To Answer (03/04/2023)   PRAPARE - Transportation    Lack of Transportation (Medical): Patient unable to answer    Lack of Transportation (Non-Medical): Patient unable to answer  Physical Activity: Not on file  Stress: Not on file  Social Connections: Not on file  Intimate Partner Violence: Patient Unable To Answer (03/04/2023)   Humiliation, Afraid, Rape,  and Kick questionnaire    Fear of Current or Ex-Partner: Patient unable to answer    Emotionally Abused: Patient unable to answer    Physically Abused: Patient unable to answer    Sexually Abused: Patient unable to answer    Review of Systems: ROS negative except for what is noted on the assessment and plan.  Vitals:   09/14/23 1538  BP: 131/72  Pulse: 64  Temp: 98.1 F (36.7 C)  SpO2: 99%  Weight: 195 lb 6.4 oz (88.6 kg)  Height: 5\' 7"  (1.702 m)    Physical Exam: Constitutional: well-appearing, sitting in chair, in no acute distress Cardiovascular: regular rate and rhythm, no m/r/g Pulmonary/Chest: normal work of breathing on room air, lungs clear to auscultation bilaterally Abdominal: soft, non-tender, non-distended MSK: normal bulk and tone Skin: warm and dry Psych: normal mood and behavior  Assessment & Plan:     Patient discussed with Dr. Oswaldo Done  Diabetes [facility]) Recent diagnosis as he was admitted for DKA 02/2023.  Unclear if he is a type I versus type II given mixed presentation.  No antibody testing previously and patient is currently self-pay (in-process of applying for CAFA).  A1c was 6.1 06/2023 which had improved from admission (14.8).  Currently managed with glargine 18 units daily and has successfully uptitrated metformin to 1000 mg twice daily without complication.  Denies polyuria, polydipsia, neuropathy.  CGM reviewed but date/time were off so difficult to determine overall trend.  This was fixed in office today.  No episodes of hypoglycemia or severe hyperglycemia.  Lantus is no longer covered under IM program so we will transition to Basaglar. -Continue Basaglar 18 units/daily and metformin 1000 mg twice daily -Follow-up/repeat A1c next month -Consider antibody testing future -Ophthalmology referral sent  Hypertension associated with diabetes (HCC) Well-controlled.  Continue losartan 25 mg/daily   Charles Cooper, D.O. [facility],  PGY-1 Phone: [phone] Date 09/15/2023 Time 8:28 AM

## 2023-09-15 NOTE — Assessment & Plan Note (Addendum)
Recent diagnosis as he was admitted for DKA 02/2023.  Unclear if he is a type I versus type II given mixed presentation.  No antibody testing previously and patient is currently self-pay (in-process of applying for CAFA).  A1c was 6.1 06/2023 which had improved from admission (14.8).  Currently managed with glargine 18 units daily and has successfully uptitrated metformin to 1000 mg twice daily without complication.  Denies polyuria, polydipsia, neuropathy.  CGM reviewed but date/time were off so difficult to determine overall trend.  This was fixed in office today.  No episodes of hypoglycemia or severe hyperglycemia.  Lantus is no longer covered under IM program so we will transition to Basaglar. -Continue Basaglar 18 units/daily and metformin 1000 mg twice daily -Follow-up/repeat A1c next month -Consider antibody testing future -Ophthalmology referral sent

## 2023-09-15 NOTE — Progress Notes (Signed)
 Internal Medicine Clinic Attending  Case discussed with the resident physician at the time of the visit.  We reviewed the patient's history, exam, and pertinent patient test results.  I agree with the assessment, diagnosis, and plan of care documented in the resident's note.

## 2023-09-15 NOTE — Assessment & Plan Note (Signed)
Well controlled.  - Continue losartan 25mg daily.

## 2023-10-28 ENCOUNTER — Other Ambulatory Visit (HOSPITAL_COMMUNITY): Payer: Self-pay

## 2023-10-28 ENCOUNTER — Other Ambulatory Visit: Payer: Self-pay

## 2023-10-29 ENCOUNTER — Other Ambulatory Visit: Payer: Self-pay

## 2023-11-02 ENCOUNTER — Other Ambulatory Visit: Payer: Self-pay

## 2023-11-13 ENCOUNTER — Other Ambulatory Visit (HOSPITAL_BASED_OUTPATIENT_CLINIC_OR_DEPARTMENT_OTHER): Payer: Self-pay

## 2023-11-15 ENCOUNTER — Other Ambulatory Visit (HOSPITAL_COMMUNITY): Payer: Self-pay

## 2023-11-15 ENCOUNTER — Other Ambulatory Visit: Payer: Self-pay

## 2023-11-24 ENCOUNTER — Other Ambulatory Visit: Payer: Self-pay

## 2023-11-26 ENCOUNTER — Encounter (HOSPITAL_COMMUNITY): Payer: Self-pay

## 2023-11-27 ENCOUNTER — Other Ambulatory Visit (HOSPITAL_COMMUNITY): Payer: Self-pay

## 2023-11-27 ENCOUNTER — Other Ambulatory Visit: Payer: Self-pay

## 2023-12-17 ENCOUNTER — Other Ambulatory Visit (HOSPITAL_COMMUNITY): Payer: Self-pay

## 2023-12-17 ENCOUNTER — Other Ambulatory Visit: Payer: Self-pay

## 2024-01-10 ENCOUNTER — Encounter: Payer: Self-pay | Admitting: *Deleted

## 2024-01-31 ENCOUNTER — Other Ambulatory Visit: Payer: Self-pay

## 2024-02-23 ENCOUNTER — Other Ambulatory Visit: Payer: Self-pay

## 2024-03-30 ENCOUNTER — Other Ambulatory Visit: Payer: Self-pay

## 2024-03-31 ENCOUNTER — Other Ambulatory Visit: Payer: Self-pay

## 2024-04-24 ENCOUNTER — Other Ambulatory Visit: Payer: Self-pay

## 2024-04-29 ENCOUNTER — Other Ambulatory Visit (HOSPITAL_COMMUNITY): Payer: Self-pay

## 2024-04-29 ENCOUNTER — Other Ambulatory Visit: Payer: Self-pay

## 2024-05-01 ENCOUNTER — Other Ambulatory Visit: Payer: Self-pay

## 2024-05-18 ENCOUNTER — Other Ambulatory Visit: Payer: Self-pay

## 2024-05-27 ENCOUNTER — Other Ambulatory Visit: Payer: Self-pay

## 2024-05-29 ENCOUNTER — Other Ambulatory Visit: Payer: Self-pay

## 2024-05-30 ENCOUNTER — Other Ambulatory Visit: Payer: Self-pay

## 2024-06-27 ENCOUNTER — Other Ambulatory Visit: Payer: Self-pay

## 2024-06-28 ENCOUNTER — Other Ambulatory Visit: Payer: Self-pay

## 2024-07-26 ENCOUNTER — Other Ambulatory Visit: Payer: Self-pay | Admitting: Student

## 2024-07-26 ENCOUNTER — Other Ambulatory Visit: Payer: Self-pay

## 2024-07-28 ENCOUNTER — Other Ambulatory Visit: Payer: Self-pay

## 2024-07-28 ENCOUNTER — Ambulatory Visit: Payer: Self-pay | Admitting: Student

## 2024-07-28 MED FILL — Insulin Glargine Soln Pen-Injector 100 Unit/ML: SUBCUTANEOUS | 33 days supply | Qty: 6 | Fill #0 | Status: AC

## 2024-07-28 NOTE — Telephone Encounter (Signed)
 Patient last seen 09/14/23 I called the patient to schedule a appointment. I was unable to reach the patient. Call cannot be completed at this time.

## 2024-08-02 ENCOUNTER — Ambulatory Visit: Payer: Self-pay

## 2024-08-10 ENCOUNTER — Ambulatory Visit: Payer: Self-pay

## 2024-08-10 ENCOUNTER — Other Ambulatory Visit (HOSPITAL_COMMUNITY): Payer: Self-pay

## 2024-08-10 VITALS — BP 130/71 | HR 103 | Temp 98.4°F | Ht 67.0 in | Wt 180.8 lb

## 2024-08-10 DIAGNOSIS — Z794 Long term (current) use of insulin: Secondary | ICD-10-CM

## 2024-08-10 DIAGNOSIS — I10 Essential (primary) hypertension: Secondary | ICD-10-CM

## 2024-08-10 DIAGNOSIS — Z7984 Long term (current) use of oral hypoglycemic drugs: Secondary | ICD-10-CM

## 2024-08-10 DIAGNOSIS — I152 Hypertension secondary to endocrine disorders: Secondary | ICD-10-CM

## 2024-08-10 DIAGNOSIS — E1369 Other specified diabetes mellitus with other specified complication: Secondary | ICD-10-CM

## 2024-08-10 DIAGNOSIS — E1159 Type 2 diabetes mellitus with other circulatory complications: Secondary | ICD-10-CM

## 2024-08-10 LAB — POCT GLYCOSYLATED HEMOGLOBIN (HGB A1C): HbA1c, POC (controlled diabetic range): 5.2 % (ref 0.0–7.0)

## 2024-08-10 LAB — GLUCOSE, CAPILLARY: Glucose-Capillary: 159 mg/dL — ABNORMAL HIGH (ref 70–99)

## 2024-08-10 MED ORDER — LOSARTAN POTASSIUM 25 MG PO TABS
25.0000 mg | ORAL_TABLET | Freq: Every day | ORAL | 3 refills | Status: AC
  Filled 2024-08-10 – 2024-08-24 (×2): qty 30, 30d supply, fill #0
  Filled 2024-08-25 (×2): qty 90, 90d supply, fill #0

## 2024-08-10 NOTE — Progress Notes (Unsigned)
 "  Established Patient Office Visit  Subjective   Patient ID: Charles Cooper, male    DOB: [birth year]  Age: 34 y.o. MRN: [id number]  Chief Complaint  Patient presents with   Medical Management of Chronic Issues    Overdue for visit     Insomnia    Has had 8hr sleep in months    HPI Patient is a 34 year old male with PMH of Daibetes and hypertension that presents today for follow up of chronic conditions. See problem based assessment and plan for more details. Patient denied any other acute complaints and deferred further management due to not having insurance.    ROS See problem based plan and assessment for more details.    Objective:     BP 130/71 (BP Location: Right Arm, Patient Position: Sitting, Cuff Size: Normal)   Pulse (!) 103   Temp 98.4 F (36.9 C) (Oral)   Ht 5' 7 (1.702 m)   Wt 180 lb 12.8 oz (82 kg)   SpO2 99%   BMI 28.32 kg/m  BP Readings from Last 3 Encounters:  08/10/24 130/71  09/14/23 131/72  07/19/23 125/76      Physical Exam Constitutional:      General: He is not in acute distress. Cardiovascular:     Rate and Rhythm: Normal rate and regular rhythm.     Heart sounds: No murmur heard. Pulmonary:     Effort: Pulmonary effort is normal. No respiratory distress.     Breath sounds: No wheezing.  Musculoskeletal:     Comments: Calluses noted on bilateral feet but no open wounds   Neurological:     Mental Status: He is alert.     Results for orders placed or performed in visit on 08/10/24  Glucose, capillary  Result Value Ref Range   Glucose-Capillary 159 (H) 70 - 99 mg/dL  POCT glycosylated hemoglobin (Hb A1C)  Result Value Ref Range   Hemoglobin A1C     HbA1c POC (<> result, manual entry)     HbA1c, POC (prediabetic range)     HbA1c, POC (controlled diabetic range) 5.2 0.0 - 7.0 %      The ASCVD Risk score (Arnett DK, et al., 2019) failed to calculate for the following reasons:   The 2019 ASCVD risk score is only  valid for ages 43 to 78   * - Cholesterol units were assumed    Assessment & Plan:   Problem List Items Addressed This Visit       Cardiovascular and Mediastinum   Hypertension associated with diabetes (HCC)   BP normotensive today. No acute concerns   Plan: Continue Losartan  25 mg       Relevant Medications   losartan  (COZAAR ) 25 MG tablet     Endocrine   Diabetes (HCC) - Primary   A1c today is 5.2. Patient discussed differentiation of diabetes types, but did inform him we can look further into this testing when he has insurance. Responding well to treatment   Plan: Continue Basaglar  18 units daily and metformin  1000 mg BID       Relevant Medications   losartan  (COZAAR ) 25 MG tablet   Other Relevant Orders   POCT glycosylated hemoglobin (Hb A1C) (Completed)   Other Visit Diagnoses       Hypertension, unspecified type       Relevant Medications   losartan  (COZAAR ) 25 MG tablet      No follow-ups on file.    Irean Kendricks D'Mello, DO Patient discussed  with Dr. Shawn  "

## 2024-08-10 NOTE — Progress Notes (Unsigned)
 Internal Medicine Clinic Attending  Case discussed with the resident at the time of the visit.  We reviewed the resident's history and exam and pertinent patient test results.  I agree with the assessment, diagnosis, and plan of care documented in the resident's note.

## 2024-08-11 NOTE — Assessment & Plan Note (Signed)
 BP normotensive today. No acute concerns   Plan: Continue Losartan  25 mg

## 2024-08-11 NOTE — Assessment & Plan Note (Signed)
 A1c today is 5.2. Patient discussed differentiation of diabetes types, but did inform him we can look further into this testing when he has insurance. Responding well to treatment   Plan: Continue Basaglar  18 units daily and metformin  1000 mg BID

## 2024-08-14 NOTE — Progress Notes (Signed)
 Patient did not show up for this appt, npo charge for this encounter.

## 2024-08-24 ENCOUNTER — Other Ambulatory Visit (HOSPITAL_COMMUNITY): Payer: Self-pay

## 2024-08-24 ENCOUNTER — Other Ambulatory Visit: Payer: Self-pay

## 2024-08-24 MED FILL — Insulin Glargine Soln Pen-Injector 100 Unit/ML: SUBCUTANEOUS | 33 days supply | Qty: 6 | Fill #1 | Status: CN

## 2024-08-24 MED FILL — Insulin Glargine Soln Pen-Injector 100 Unit/ML: SUBCUTANEOUS | 33 days supply | Qty: 6 | Fill #1 | Status: AC

## 2024-08-25 ENCOUNTER — Other Ambulatory Visit: Payer: Self-pay
